# Patient Record
Sex: Male | Born: 1975 | Race: White | Hispanic: No | Marital: Married | Smoking: Never smoker
Health system: Southern US, Community
[De-identification: ages and names within clinical notes are randomized; demographics above are authoritative.]

## PROBLEM LIST (undated history)

## (undated) DIAGNOSIS — F1011 Alcohol abuse, in remission: Secondary | ICD-10-CM

## (undated) DIAGNOSIS — F909 Attention-deficit hyperactivity disorder, unspecified type: Secondary | ICD-10-CM

## (undated) DIAGNOSIS — F4310 Post-traumatic stress disorder, unspecified: Secondary | ICD-10-CM

## (undated) DIAGNOSIS — F32A Depression, unspecified: Secondary | ICD-10-CM

## (undated) DIAGNOSIS — Z87442 Personal history of urinary calculi: Secondary | ICD-10-CM

## (undated) DIAGNOSIS — J189 Pneumonia, unspecified organism: Secondary | ICD-10-CM

## (undated) DIAGNOSIS — F329 Major depressive disorder, single episode, unspecified: Secondary | ICD-10-CM

## (undated) DIAGNOSIS — H6060 Unspecified chronic otitis externa, unspecified ear: Secondary | ICD-10-CM

## (undated) DIAGNOSIS — B029 Zoster without complications: Secondary | ICD-10-CM

## (undated) DIAGNOSIS — J329 Chronic sinusitis, unspecified: Secondary | ICD-10-CM

## (undated) DIAGNOSIS — G4730 Sleep apnea, unspecified: Secondary | ICD-10-CM

## (undated) DIAGNOSIS — T6701XA Heatstroke and sunstroke, initial encounter: Secondary | ICD-10-CM

## (undated) DIAGNOSIS — K219 Gastro-esophageal reflux disease without esophagitis: Secondary | ICD-10-CM

## (undated) HISTORY — DX: Major depressive disorder, single episode, unspecified: F32.9

## (undated) HISTORY — DX: Zoster without complications: B02.9

## (undated) HISTORY — DX: Chronic sinusitis, unspecified: J32.9

## (undated) HISTORY — PX: OTHER SURGICAL HISTORY: SHX169

## (undated) HISTORY — DX: Depression, unspecified: F32.A

## (undated) HISTORY — DX: Unspecified chronic otitis externa, unspecified ear: H60.60

## (undated) HISTORY — DX: Heatstroke and sunstroke, initial encounter: T67.01XA

## (undated) HISTORY — PX: KNEE SURGERY: SHX244

## (undated) HISTORY — DX: Attention-deficit hyperactivity disorder, unspecified type: F90.9

---

## 2006-03-15 HISTORY — PX: SEPTOPLASTY: SUR1290

## 2007-05-03 ENCOUNTER — Ambulatory Visit: Payer: Self-pay | Admitting: Critical Care Medicine

## 2007-05-03 ENCOUNTER — Ambulatory Visit: Payer: Self-pay | Admitting: Internal Medicine

## 2007-05-03 DIAGNOSIS — J309 Allergic rhinitis, unspecified: Secondary | ICD-10-CM | POA: Insufficient documentation

## 2007-05-03 DIAGNOSIS — F329 Major depressive disorder, single episode, unspecified: Secondary | ICD-10-CM

## 2007-05-03 DIAGNOSIS — J310 Chronic rhinitis: Secondary | ICD-10-CM | POA: Insufficient documentation

## 2007-05-04 ENCOUNTER — Ambulatory Visit: Payer: Self-pay | Admitting: Internal Medicine

## 2007-05-04 LAB — CONVERTED CEMR LAB
Basophils Relative: 0.1 % (ref 0.0–1.0)
Hemoglobin: 16.7 g/dL (ref 13.0–17.0)
Lymphocytes Relative: 11.7 % — ABNORMAL LOW (ref 12.0–46.0)
MCHC: 33.1 g/dL (ref 30.0–36.0)
MCV: 92.3 fL (ref 78.0–100.0)
Monocytes Absolute: 0.2 10*3/uL (ref 0.2–0.7)
Monocytes Relative: 1.9 % — ABNORMAL LOW (ref 3.0–11.0)
Neutro Abs: 8.5 10*3/uL — ABNORMAL HIGH (ref 1.4–7.7)
Neutrophils Relative %: 86 % — ABNORMAL HIGH (ref 43.0–77.0)
Platelets: 269 10*3/uL (ref 150–400)
RBC: 5.45 M/uL (ref 4.22–5.81)
RDW: 12.9 % (ref 11.5–14.6)
WBC: 9.9 10*3/uL (ref 4.5–10.5)

## 2007-05-05 ENCOUNTER — Telehealth: Payer: Self-pay | Admitting: Critical Care Medicine

## 2007-05-05 DIAGNOSIS — K219 Gastro-esophageal reflux disease without esophagitis: Secondary | ICD-10-CM | POA: Insufficient documentation

## 2007-06-14 ENCOUNTER — Ambulatory Visit: Payer: Self-pay | Admitting: Internal Medicine

## 2007-12-14 ENCOUNTER — Ambulatory Visit: Payer: Self-pay | Admitting: Internal Medicine

## 2008-06-20 ENCOUNTER — Ambulatory Visit: Payer: Self-pay | Admitting: Internal Medicine

## 2009-01-08 ENCOUNTER — Ambulatory Visit (HOSPITAL_COMMUNITY): Admission: RE | Admit: 2009-01-08 | Discharge: 2009-01-08 | Payer: Self-pay | Admitting: Gastroenterology

## 2009-01-17 ENCOUNTER — Encounter: Admission: RE | Admit: 2009-01-17 | Discharge: 2009-01-17 | Payer: Self-pay | Admitting: Gastroenterology

## 2009-05-02 ENCOUNTER — Ambulatory Visit: Payer: Self-pay | Admitting: Internal Medicine

## 2009-05-02 ENCOUNTER — Emergency Department (HOSPITAL_COMMUNITY): Admission: EM | Admit: 2009-05-02 | Discharge: 2009-05-02 | Payer: Self-pay | Admitting: Emergency Medicine

## 2009-05-02 DIAGNOSIS — G4700 Insomnia, unspecified: Secondary | ICD-10-CM | POA: Insufficient documentation

## 2009-06-25 ENCOUNTER — Telehealth (INDEPENDENT_AMBULATORY_CARE_PROVIDER_SITE_OTHER): Payer: Self-pay | Admitting: *Deleted

## 2009-07-13 ENCOUNTER — Emergency Department (HOSPITAL_COMMUNITY)
Admission: EM | Admit: 2009-07-13 | Discharge: 2009-07-13 | Payer: Self-pay | Source: Home / Self Care | Admitting: Emergency Medicine

## 2009-10-03 ENCOUNTER — Ambulatory Visit: Payer: Self-pay | Admitting: Critical Care Medicine

## 2009-10-03 DIAGNOSIS — F1020 Alcohol dependence, uncomplicated: Secondary | ICD-10-CM | POA: Insufficient documentation

## 2009-10-03 DIAGNOSIS — J018 Other acute sinusitis: Secondary | ICD-10-CM

## 2009-10-03 DIAGNOSIS — F4310 Post-traumatic stress disorder, unspecified: Secondary | ICD-10-CM

## 2009-10-20 ENCOUNTER — Telehealth (INDEPENDENT_AMBULATORY_CARE_PROVIDER_SITE_OTHER): Payer: Self-pay | Admitting: *Deleted

## 2009-10-27 ENCOUNTER — Telehealth (INDEPENDENT_AMBULATORY_CARE_PROVIDER_SITE_OTHER): Payer: Self-pay | Admitting: *Deleted

## 2009-12-05 ENCOUNTER — Ambulatory Visit: Payer: Self-pay | Admitting: Critical Care Medicine

## 2009-12-05 DIAGNOSIS — R892 Abnormal level of other drugs, medicaments and biological substances in specimens from other organs, systems and tissues: Secondary | ICD-10-CM | POA: Insufficient documentation

## 2009-12-05 LAB — CONVERTED CEMR LAB
AST: 21 units/L (ref 0–37)
Bilirubin, Direct: 0.1 mg/dL (ref 0.0–0.3)
Total Bilirubin: 1.2 mg/dL (ref 0.3–1.2)

## 2009-12-08 ENCOUNTER — Encounter: Payer: Self-pay | Admitting: Critical Care Medicine

## 2009-12-08 LAB — CONVERTED CEMR LAB: IgE (Immunoglobulin E), Serum: 71.2 intl units/mL (ref 0.0–180.0)

## 2009-12-11 ENCOUNTER — Telehealth: Payer: Self-pay | Admitting: Critical Care Medicine

## 2009-12-18 ENCOUNTER — Encounter: Payer: Self-pay | Admitting: Critical Care Medicine

## 2009-12-23 ENCOUNTER — Ambulatory Visit: Payer: Self-pay | Admitting: Critical Care Medicine

## 2009-12-23 DIAGNOSIS — J45909 Unspecified asthma, uncomplicated: Secondary | ICD-10-CM

## 2010-01-20 ENCOUNTER — Ambulatory Visit: Payer: Self-pay | Admitting: Critical Care Medicine

## 2010-01-28 ENCOUNTER — Encounter: Payer: Self-pay | Admitting: Critical Care Medicine

## 2010-02-17 ENCOUNTER — Ambulatory Visit: Payer: Self-pay | Admitting: Critical Care Medicine

## 2010-03-25 ENCOUNTER — Ambulatory Visit
Admission: RE | Admit: 2010-03-25 | Discharge: 2010-03-25 | Payer: Self-pay | Source: Home / Self Care | Attending: Critical Care Medicine | Admitting: Critical Care Medicine

## 2010-03-28 ENCOUNTER — Emergency Department (HOSPITAL_COMMUNITY)
Admission: EM | Admit: 2010-03-28 | Discharge: 2010-03-28 | Payer: Self-pay | Source: Home / Self Care | Admitting: Emergency Medicine

## 2010-04-03 ENCOUNTER — Encounter: Payer: Self-pay | Admitting: Critical Care Medicine

## 2010-04-14 NOTE — Progress Notes (Signed)
Summary: talk to nurse - LMTCB x 1  Phone Note Call from Patient Call back at [facility] Phone [phone]   Caller: Patient Call For: wright Summary of Call: Pt states he needs a letter from PW stating that it's not recommended or likely that he'll return to work due to his disability. Initial call taken by: Darletta Moll,  October 20, 2009 9:42 AM  Follow-up for Phone Call        [facility] Yetta Barre RN  October 20, 2009 11:49 AM  pt states he needs a letter from dr Delford Field stating he does not rec. or it is not likely pt will be able to return to work, pt states pw is aware of this and that his pft's were done for his disability, pt states he meetas with the social security office on thursday at [facility] and would need this letter prior to appt---pls advise  Follow-up by: Lowell Makara CMA,  October 20, 2009 4:58 PM  Additional Follow-up for Phone Call Additional follow up Details #1::        I cannot write such a letter.  it is up to the disability office to make this determination All records can be sent to disability office  I do not write such letters Additional Follow-up by: Storm Frisk MD,  October 20, 2009 5:12 PM    Additional Follow-up for Phone Call Additional follow up Details #2::    Spoke with pt and notified of the above recs per PW.  Pt verbalized understanding. Follow-up by: Vernie Murders,  October 20, 2009 5:16 PM

## 2010-04-14 NOTE — Medication Information (Signed)
Summary: Clarification for Xolair/[facility]  Clarification for Xolair/[facility]   Imported By: Sherian Rein 12/17/2009 08:43:23  _____________________________________________________________________  External Attachment:    Type:   Image     Comment:   External Document

## 2010-04-14 NOTE — Medication Information (Signed)
Summary: Levocetirizine / [facility]  Levocetirizine / [facility]   Imported By: Lennie Odor 02/02/2010 16:59:13  _____________________________________________________________________  External Attachment:    Type:   Image     Comment:   External Document

## 2010-04-14 NOTE — Assessment & Plan Note (Signed)
Summary: xolair/jd  Nurse Visit   Allergies: 1)  ! * Antrax Vaccine 2)  ! * Beta 2 Angonist 3)  ! * Spiriva  Medication Administration  Injection # 1:    Medication: Xolair (omalizumab) 150mg     Route: SQ    Site: L deltoid    Exp Date: 12/2012    Lot #: [id number]    Mfr: Salome Spotted    Comments: 1.2 ml in left arm pt waited 2 hrs     Given by: tammy scott in allergy lab  Orders Added: 1)  Administration xolair injection [78295]   Medication Administration  Injection # 1:    Medication: Xolair (omalizumab) 150mg     Route: SQ    Site: L deltoid    Exp Date: 12/2012    Lot #: [id number]    Mfr: Salome Spotted    Comments: 1.2 ml in left arm pt waited 2 hrs     Given by: tammy scott in allergy lab  Orders Added: 1)  Administration xolair injection [phone]

## 2010-04-14 NOTE — Progress Notes (Signed)
Summary: clarification on Xolair  Phone Note From Pharmacy Call back at [phone]   Caller: Patient Caller: [facility]  ( kara Call For: wright  Summary of Call: need clarification on xolair 30 or 90 Initial call taken by: Rickard Patience,  December 11, 2009 11:26 AM  Follow-up for Phone Call        will forward message to [facility] to address.  Aundra Millet Reynolds LPN  December 11, 2009 11:46 AM   Called and spoke with [facility]. They were requesting to know if we needed a 30 day supply or 90 day supply. I informed her that if ins would pay for a 90 day supply then that's what we would prefer. Spoke with pharmacist at [facility] and she will be sending a 90 day supply if ins will approve. Alfonso Ramus  December 11, 2009 12:14 PM

## 2010-04-14 NOTE — Progress Notes (Signed)
  Phone Note Other Incoming   Request: Send information Summary of Call: Request for records received from [facility]. Request forwarded to [facility].     

## 2010-04-14 NOTE — Assessment & Plan Note (Signed)
Summary: Pulmonary OV   Copy to:  [facility] Primary Provider/Referring Provider:  T. Spear  CC:  Follow up.  Pt was last seen 04/2007.  Pt states breathing is "tight this morning."  States he can no longer take advair-would like to discuss changing asthma meds and requesting PFTs.  .  History of Present Illness: Pulmonary OV  35yo WM with severe persistent asthma, chronic sinusitis, severe atopy Hx Etoh abuse, now in detox  May 02, 2009- Rhinosinusitis, asthma Asks about "steroid" side effects. Tells me he had used ambien x 3 years - last taken 2 nights ago on 04/30/09. Last took a prednisone taper from Dr Collins [place] 2 weeks ago. Had gotten phenergan inj 2/14. Claims he was "completely unconcious for a day after that" for N&V/ GI bug shared with his son. Sleep walking night of 2/16- thinks he was dreaming of climbing a mountain. He climbed pull down attic stair, fell, scraping arm and cutting knee. 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 Has had nightmares, was in counseling for awhile, bad trip on Nyquil in past, but never told PTSD. Now he's afraid of sleeping, afraid he might be violent. Family stress with wife. Says no ETOH in long time. 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 Similar event in past when he was drinking and on theophylline. Has been on pred taper 3-5 x/ year. He is taking both Advair and Qvar.    October 03, 2009 9:22 AM Last seen 2/09 for asthma eval Dr young has worked with the patient more recently last seen 2/11 per allergy. The pt saw dr bates and did ear surgery.  this drained the sinuses.  The pt then saw dr young and did not do the allergy injections, he did not f/u on allergy testing Pt now under detox for alcohol abuse. He cannot tol LABAs due to tachycardia and HTN side effects He is on ICS two forms.  He cannot tol steroids.Presents a list of multiple meds he needs to avoid due to  abuse potential Pt still dyspneic and uses SABAs daily    Asthma History    Initial Asthma Severity Rating:    Age range: 12+ years    Symptoms: daily    Nighttime Awakenings: >1/week but not nightly    Interferes w/ normal activity: some limitations    SABA use (not for EIB): daily    Exacerbations requiring oral systemic steroids: 0-1/year    Asthma Severity Assessment: Moderate Persistent   Preventive Screening-Counseling & Management  Alcohol-Tobacco     Smoking Status: never  Current Medications (verified): 1)  Albuterol Sulfate (2.5 Mg/65ml) 0.083%  Nebu (Albuterol Sulfate) .... As Needed 2)  Nasonex 50 Mcg/act  Susp (Mometasone Furoate) .... Two Puffs Each Nostril Daily 3)  Xyzal 5 Mg Tabs (Levocetirizine Dihydrochloride) .... 2 Once Daily 4)  Qvar 80 Mcg/act  Aers (Beclomethasone Dipropionate) .... Two  Puffs Twice Daily 5)  Proventil Hfa 108 (90 Base) Mcg/act Aers (Albuterol Sulfate) .... 2puffs Four Times A Day Prn 6)  Prevacid 30 Mg Cpdr (Lansoprazole) .... Take 1 By Mouth Once Daily 7)  Pulmicort 0.25 Mg/33ml Susp (Budesonide) .... Once Daily  Allergies (verified): 1)  ! * Antrax Vaccine  Past History:  Past medical, surgical, family and social histories (including risk factors) reviewed, and no changes noted (except as noted below).  Past Medical History: Reviewed history from 12/14/2007 and no changes required. Chronic  otitis asthma- Pos Immunocap and Skin tests 06/14/07 shingles sinusitis heat stroke age 76 depression ADHD  Past Surgical History: Reviewed history from 06/20/2008 and no changes required. Septoplasty bilateral tympanoplasty and removal of cholesteatoma  Family History: Reviewed history from 05/04/2007 and no changes required. GF died stomach cancer Father allergic rhinitis, sleep apnea MI/Heart Attack  Social History: Reviewed history from 05/02/2009 and no changes required. 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  The patient denies coughing up blood, chest pain, irregular heartbeats, acid heartburn, indigestion, loss of appetite, weight change, abdominal pain, difficulty swallowing, sore throat, tooth/dental problems, headaches, sneezing, itching, ear ache, anxiety, depression, hand/feet swelling, joint stiffness or pain, rash, change in color of mucus, and fever.    Vital Signs:  Patient profile:   35 year old male Height:      68 inches Weight:      188.50 pounds BMI:     28.76 O2 Sat:      95 % on Room air Temp:     97.8 degrees F oral Pulse rate:   84 / minute BP sitting:   110 / 80  (left arm) Cuff size:   regular  Vitals Entered By: Gweneth Dimitri RN (October 03, 2009 9:18 AM)  O2 Flow:  Room air CC: Follow up.  Pt was last seen 04/2007.  Pt states breathing is "tight this morning."  States he can no longer take advair-would like to discuss changing asthma meds and requesting PFTs.   Comments Medications reviewed with patient Daytime contact number verified with patient. Gweneth Dimitri RN  October 03, 2009 9:19 AM    Physical Exam  Additional Exam:  General: A/Ox3; pleasant and cooperative, NAD, anxious/ stressed, alert, communicative, coherent SKIN: no rash, lesions NODES: no lymphadenopathy HEENT: [place]/AT, EOM- WNL, Conjuctivae- clear, PERRLA, TM-WNL, Nose- bilat purulent material in both nares, Throat- clear and wnl NECK: Supple w/ fair ROM, JVD- none, normal carotid impulses w/o bruits Thyroid- CHEST: exp wheeze, poor airflow HEART: RRR, no m/g/r heard ABDOMEN:  ZOX:WRUE, nl pulses, no edema  NEURO: Grossly intact to observation, anxious but no tremor.      Pulmonary Function Test Date: 10/03/2009 09:41 AM Gender:  Male  Pre-Spirometry FVC    Value: 2.83 L/min   % Pred: 56 % FEV1    Value: 1.99 L     Pred: 4.11 L     % Pred: 48.30 % FEV1/FVC  Value: 70.19 %     % Pred: 86.40 %  Impression & Recommendations:  Problem # 1:  OTHER ACUTE SINUSITIS (ICD-461.8) Assessment Deteriorated acute on chronic sinusitis with asthma flare plan avelox x 7 days depomedrol 120mg  IM ,  avoid prednisone nasal hygiene  His updated medication list for this problem includes:    Nasonex 50 Mcg/act Susp (Mometasone furoate) .Marland Kitchen..Marland Kitchen Two puffs each nostril daily    Avelox 400 Mg Tabs (Moxifloxacin hcl) ..... By mouth daily  Orders: Est. Patient Level IV (45409)  Problem # 2:  ASTHMA (ICD-V17.5) Assessment: Deteriorated severe persistent asthma with flare plan depo medrol 120mg  IM spiro shows severe airflow obstruction trial spiriva increase qvar to 4puff two times a day   Orders: Est. Patient Level IV (81191) Spirometry w/Graph (47829) Depo- Medrol 40mg  (J1030) Depo- Medrol  80mg  (J1040) Admin of Therapeutic Inj  intramuscular or subcutaneous (04540)  Medications Added to Medication List This Visit: 1)  Xyzal 5 Mg Tabs (Levocetirizine dihydrochloride) .... 2 once daily 2)  Qvar 80 Mcg/act Aers (Beclomethasone dipropionate) .... 4   puffs twice daily 3)  Pulmicort 0.25 Mg/18ml Susp (Budesonide) .... Once daily 4)  Avelox 400 Mg Tabs (Moxifloxacin hcl) .... By mouth daily 5)  Spiriva Handihaler 18 Mcg Caps (Tiotropium bromide monohydrate) .... Two puffs in handihaler daily  Complete Medication List: 1)  Albuterol Sulfate (2.5 Mg/81ml) 0.083% Nebu (Albuterol sulfate) .... As needed 2)  Nasonex 50 Mcg/act Susp (Mometasone furoate) .... Two puffs each nostril daily 3)  Xyzal 5 Mg Tabs (Levocetirizine dihydrochloride) .... 2 once daily 4)  Qvar 80 Mcg/act Aers (Beclomethasone dipropionate) .... 4   puffs twice daily 5)  Proventil Hfa 108 (90 Base) Mcg/act Aers (Albuterol sulfate) .... 2puffs four times a day  prn 6)  Prevacid 30 Mg Cpdr (Lansoprazole) .... Take 1 by mouth once daily 7)  Pulmicort 0.25 Mg/65ml Susp (Budesonide) .... Once daily 8)  Avelox 400 Mg Tabs (Moxifloxacin hcl) .... By mouth daily 9)  Spiriva Handihaler 18 Mcg Caps (Tiotropium bromide monohydrate) .... Two puffs in handihaler daily  Patient Instructions: 1)  A depomedrol injection 120mg  IM will be given 2)  Trial Spiriva daily 3)  Avelox one daily for 5 days (samples given) 4)  Increase Qvar to  4 puffs twice daily 5)  No other medication changes 6)  Return 2 months Prescriptions: QVAR 80 MCG/ACT  AERS (BECLOMETHASONE DIPROPIONATE) 4   puffs twice daily  #1 month x 6   Entered and Authorized by:   Storm Frisk MD   Signed by:   Storm Frisk MD on 10/03/2009   Method used:   Electronically to        [facility]. [phone]* (retail)       [address]       [place], [place]  [address]       Ph: [phone]       Fax: [phone]   RxID:   [id number] SPIRIVA HANDIHALER 18 MCG  CAPS (TIOTROPIUM BROMIDE MONOHYDRATE) Two puffs in handihaler daily  #30 x 6   Entered and Authorized by:   Storm Frisk MD   Signed by:   Storm Frisk MD on 10/03/2009   Method used:   Print then Give to Patient   RxID:   [id number]      CardioPerfect Spirometry  ID: [id number] Patient: Chad Hurley DOB: 06/21/1975 Age: 35 Years Old Sex: Male Race: White Physician: Delford Field Height: 68 Weight: 188.50 Status: Confirmed Past Medical History:  Chronic otitis asthma- Pos Immunocap and Skin tests 06/14/07 shingles sinusitis heat stroke age 32 depression ADHD Recorded: 10/03/2009 09:41 AM  Parameter  Measured Predicted %Predicted FVC     2.83        5.05        56 FEV1     1.99        4.11        48.30 FEV1%   70.19        81.25        86.40 PEF    4.85        9.75        49.80   Comments: Severe obstructive defect  Interpretation: Pre: FVC= 2.83L FEV1=  1.99L FEV1%= 70.2% 1.99/2.83  FEV1/FVC (10/03/2009 9:51:28 AM), Severe obstruction.Moderately severe restriction       Medication Administration  Injection # 1:    Medication: Depo- Medrol 40mg     Diagnosis: ASTHMA (ICD-V17.5)    Route: IM    Site: LUOQ gluteus    Exp Date: 06/2012    Lot #: [id number]    Mfr: Pharmacia    Patient tolerated injection without complications    Given by: Gweneth Dimitri RN (October 03, 2009 10:10 AM)  Injection # 2:    Medication: Depo- Medrol 80mg     Diagnosis: ASTHMA (ICD-V17.5)    Route: IM    Site: LUOQ gluteus    Exp Date: 06/2012    Lot #: [id number]    Mfr: Pharmacia    Patient tolerated injection without complications    Given by: Gweneth Dimitri RN (October 03, 2009 10:10 AM)  Orders Added: 1)  Est. Patient Level IV [16109] 2)  Spirometry w/Graph [94010] 3)  Depo- Medrol 40mg  [J1030] 4)  Depo- Medrol 80mg  [J1040] 5)  Admin of Therapeutic Inj  intramuscular or subcutaneous [96372]   Appended Document: Pulmonary OV fax Herb Grays

## 2010-04-14 NOTE — Miscellaneous (Signed)
Summary: Injection program for [employer] program for [employer]   Imported By: Sherian Rein 12/29/2009 11:44:49  _____________________________________________________________________  External Attachment:    Type:   Image     Comment:   External Document

## 2010-04-14 NOTE — Letter (Signed)
Summary: [facility] for [facility]air[facility]  [facility] for [facility]   Imported By: Sherian Rein 12/29/2009 11:48:43  _____________________________________________________________________  External Attachment:    Type:   Image     Comment:   External Document

## 2010-04-14 NOTE — Assessment & Plan Note (Signed)
Summary: Pulmonary OV   Copy to:  [facility] Primary Denasia Venn/Referring Jemuel Laursen:  T. Spear  CC:  6 wk follow up.  Pt states up until the last week breathing was going "outstanding."  increased SOB at rest and with activity, wheezing, chest tightness, and nonprod cough x 1 wk.  Pt states Geoffry Paradise is working really well.  Marland Kitchen  History of Present Illness: Pulmonary OV  34yo WM with severe persistent asthma, chronic sinusitis, severe atopy Hx Etoh abuse, now in detox  May 02, 2009- Rhinosinusitis, asthma Asks about "steroid" side effects. Tells me he had used ambien x 3 years - last taken 2 nights ago on 04/30/09. Last took a prednisone taper from Dr Collins [place] 2 weeks ago. Had gotten phenergan inj 2/14. Claims he was "completely unconcious for a day after that" for N&V/ GI bug shared with his son. Sleep walking night of 2/16- thinks he was dreaming of climbing a mountain. He climbed pull down attic stair, fell, scraping arm and cutting knee. 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 Has had nightmares, was in counseling for awhile, bad trip on Nyquil in past, but never told PTSD. Now he's afraid of sleeping, afraid he might be violent. Family stress with wife. Says no ETOH in long time. 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 Similar event in past when he was drinking and on theophylline. Has been on pred taper 3-5 x/ year. He is taking both Advair and Qvar.    October 03, 2009 9:22 AM Last seen 2/09 for asthma eval Dr young has worked with the patient more recently last seen 2/11 per allergy. The pt saw dr bates and did ear surgery.  this drained the sinuses.  The pt then saw dr young and did not do the allergy injections, he did not f/u on allergy testing Pt now under detox for alcohol abuse. He cannot tol LABAs due to tachycardia and HTN side effects He is on ICS two forms.  He cannot tol steroids.Presents a list of  multiple meds he needs to avoid due to abuse potential Pt still dyspneic and uses SABAs dailySeptember 23, 2011 4:11 PM Pt notes symptoms the same.  Pt still with frequent rescue inhaler use.   Spiriva made pt sleepy, not able to use LABAs.  Has not had xolair eval. Has never been on zyflo  January 20, 2010 9:34 AM overall this patient is improved since starting Xolair therapy. The patient is a less wheezing and cough. There is less shortness of breath. The patient states that the Xolair injection does not last month. The patient is questioning a dosage adjustment. There is no fever, chills, or sweats. There is no chest pain. Overall, the patient is improved. Patient was not able tolerate Zyflo because of  insomnia.  Current Medications (verified): 1)  Albuterol Sulfate (2.5 Mg/42ml) 0.083%  Nebu (Albuterol Sulfate) .... As Needed 2)  Nasonex 50 Mcg/act  Susp (Mometasone Furoate) .... Two Puffs Each Nostril Daily As Needed 3)  Xyzal 5 Mg Tabs (Levocetirizine Dihydrochloride) .... 2 Once Daily 4)  Qvar 80 Mcg/act  Aers (Beclomethasone Dipropionate) .... 4   Puffs Twice Daily As Needed 5)  Proventil Hfa 108 (90 Base) Mcg/act Aers (Albuterol Sulfate) .... 2puffs Four Times A Day Prn 6)  Prevacid 30 Mg Cpdr (Lansoprazole) .... 2 At Bedtime 7)  Pulmicort 0.25 Mg/64ml Susp (Budesonide) .... Once Daily As Needed 8)  Xolair 150 Mg Solr (Omalizumab) .... Once Monthly  Allergies (verified): 1)  ! * Antrax Vaccine 2)  ! * Beta 2 Angonist 3)  ! * Spiriva  Past History:  Past medical, surgical, family and social histories (including risk factors) reviewed, and no changes noted (except as noted below).  Past Medical History: Reviewed history from 12/14/2007 and no changes required. Chronic otitis asthma- Pos Immunocap and Skin tests 06/14/07 shingles sinusitis heat stroke age 5 depression ADHD  Past Surgical History: Reviewed history from 06/20/2008 and no changes  required. Septoplasty bilateral tympanoplasty and removal of cholesteatoma  Family History: Reviewed history from 05/04/2007 and no changes required. GF died stomach cancer Father allergic rhinitis, sleep apnea MI/Heart Attack  Social History: Reviewed history from 05/02/2009 and no changes required. 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  The patient denies shortness of breath at rest, productive cough, non-productive cough, coughing up blood, chest pain, irregular heartbeats, acid heartburn, indigestion, loss of appetite, weight change, abdominal pain, difficulty swallowing, sore throat, tooth/dental problems, headaches, nasal congestion/difficulty breathing through nose, sneezing, itching, ear ache, anxiety, depression, hand/feet swelling, joint stiffness or pain, rash, change in color of mucus, and fever.    Vital Signs:  Patient profile:   35 year old male Height:      68 inches Weight:      196.38 pounds BMI:     29.97 O2 Sat:      98 % on Room air Temp:     98.5 degrees F oral Pulse rate:   80 / minute BP sitting:   126 / 88  (left arm) Cuff size:   regular  Vitals Entered By: Gweneth Dimitri RN (January 20, 2010 9:29 AM)  O2 Flow:  Room air CC: 6 wk follow up.  Pt states up until the last week breathing was going "outstanding."  increased SOB at rest and with activity, wheezing, chest tightness, nonprod cough x 1 wk.  Pt states Geoffry Paradise is working really well.    Does patient need assistance? Functional Status Self care Comments Medications reviewed with patient Daytime contact number verified with patient. Crystal Jones RN  January 20, 2010 9:30 AM    Physical Exam  Additional Exam:  General: A/Ox3; pleasant and cooperative, NAD, anxious/ stressed, alert, communicative, coherent SKIN: no rash, lesions NODES: no  lymphadenopathy HEENT: [employer]/AT, EOM- WNL, Conjuctivae- clear, PERRLA, TM-WNL, Nose- bilat purulent material in both nares, Throat- clear and wnl NECK: Supple w/ fair ROM, JVD- none, normal carotid impulses w/o bruits Thyroid- CHEST: no wheeze, improved airflow HEART: RRR, no m/g/r heard ABDOMEN:  ZOX:WRUE, nl pulses, no edema  NEURO: Grossly intact to observation, anxious but no tremor.      Impression & Recommendations:  Problem # 1:  EXTRINSIC ASTHMA, UNSPECIFIED (ICD-493.00) Assessment Improved Asthma better with xolair but weight is increased 150mg  monthly not lasting this patient Plan increase xolair to 300mg  monthly Use qvar 4puffs twice daily  Medications Added to Medication List This Visit: 1)  Nasonex 50 Mcg/act Susp (Mometasone furoate) .... Two puffs each nostril daily as needed 2)  Qvar 80 Mcg/act Aers (Beclomethasone dipropionate) .... 4   puffs twice daily 3)  Prevacid 30 Mg Cpdr (Lansoprazole) .... 2 at bedtime 4)  Pulmicort 0.25 Mg/60ml Susp (Budesonide) .... Once daily as needed 5)  Xolair 150 Mg Solr (Omalizumab) .Marland KitchenMarland KitchenMarland Kitchen  300mg  subcutaneously monthly  Complete Medication List: 1)  Albuterol Sulfate (2.5 Mg/42ml) 0.083% Nebu (Albuterol sulfate) .... As needed 2)  Nasonex 50 Mcg/act Susp (Mometasone furoate) .... Two puffs each nostril daily as needed 3)  Xyzal 5 Mg Tabs (Levocetirizine dihydrochloride) .... 2 once daily 4)  Qvar 80 Mcg/act Aers (Beclomethasone dipropionate) .... 4   puffs twice daily 5)  Proventil Hfa 108 (90 Base) Mcg/act Aers (Albuterol sulfate) .... 2puffs four times a day prn 6)  Prevacid 30 Mg Cpdr (Lansoprazole) .... 2 at bedtime 7)  Pulmicort 0.25 Mg/56ml Susp (Budesonide) .... Once daily as needed 8)  Xolair 150 Mg Solr (Omalizumab) .... 300mg  subcutaneously monthly  Other Orders: Est. Patient Level IV (04540) Misc. Referral (Misc. Ref)  Patient Instructions: 1)  We will increase Xolair to 300mg  subcutaneously monthly 2)  You must use the  Qvar 4 puffs twice daily 3)  Return 2 months Prescriptions: XYZAL 5 MG TABS (LEVOCETIRIZINE DIHYDROCHLORIDE) 2 once daily  #90 day x 4   Entered and Authorized by:   Storm Frisk MD   Signed by:   Storm Frisk MD on 01/20/2010   Method used:   Faxed to ...       [facility] education [facility])       [address] [address]       [place], [place]  [address]       Ph: [phone]       Fax: [phone]   RxID:   [id number] XYZAL 5 MG TABS (LEVOCETIRIZINE DIHYDROCHLORIDE) 2 once daily  #1 month x 6   Entered and Authorized by:   Storm Frisk MD   Signed by:   Storm Frisk MD on 01/20/2010   Method used:   Electronically to        [facility]. [phone]* (retail)       [address]       [place], [place]  [address]       Ph: [phone]       Fax: [phone]   RxID:   [id number] XOLAIR 150 MG SOLR (OMALIZUMAB) 300mg  subcutaneously monthly  #3 month x 6   Entered and Authorized by:   Storm Frisk MD   Signed by:   Storm Frisk MD on 01/20/2010   Method used:   Print then Give to Patient   RxID:   [id number]     Appended Document: Pulmonary OV fax Tammy spear

## 2010-04-14 NOTE — Medication Information (Signed)
Summary: [employer] Service request for [employer]   [employer] Service request for Xolair/[facility]   Imported By: Sherian Rein 12/29/2009 11:54:26  _____________________________________________________________________  External Attachment:    Type:   Image     Comment:   External Document

## 2010-04-14 NOTE — Assessment & Plan Note (Signed)
Summary: Pulmonary OV   Copy to:  [facility] Primary Provider/Referring Provider:  T. Spear  CC:  2 month follow up.  Pt states breathing is only slightly better.  Still having SOB with any activity, waking up at night d/t SOB, wheezing, and chest tightness.  Stopped spiriva bc "it put me to sleep for two days."  .  History of Present Illness: Pulmonary OV  35yo WM with severe persistent asthma, chronic sinusitis, severe atopy Hx Etoh abuse, now in detox  May 02, 2009- Rhinosinusitis, asthma Asks about "steroid" side effects. Tells me he had used ambien x 3 years - last taken 2 nights ago on 04/30/09. Last took a prednisone taper from Dr Collins [place] 2 weeks ago. Had gotten phenergan inj 2/14. Claims he was "completely unconcious for a day after that" for N&V/ GI bug shared with his son. Sleep walking night of 2/16- thinks he was dreaming of climbing a mountain. He climbed pull down attic stair, fell, scraping arm and cutting knee. 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 Has had nightmares, was in counseling for awhile, bad trip on Nyquil in past, but never told PTSD. Now he's afraid of sleeping, afraid he might be violent. Family stress with wife. Says no ETOH in long time. 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 Similar event in past when he was drinking and on theophylline. Has been on pred taper 3-5 x/ year. He is taking both Advair and Qvar.    October 03, 2009 9:22 AM Last seen 2/09 for asthma eval Dr young has worked with the patient more recently last seen 2/11 per allergy. The pt saw dr bates and did ear surgery.  this drained the sinuses.  The pt then saw dr young and did not do the allergy injections, he did not f/u on allergy testing Pt now under detox for alcohol abuse. He cannot tol LABAs due to tachycardia and HTN side effects He is on ICS two forms.  He cannot tol steroids.Presents a list of multiple  meds he needs to avoid due to abuse potential Pt still dyspneic and uses SABAs dailySeptember 23, 2011 4:11 PM Pt notes symptoms the same.  Pt still with frequent rescue inhaler use.   Spiriva made pt sleepy, not able to use LABAs.  Has not had xolair eval. Has never been on zyflo    Asthma History    Asthma Control Assessment:    Age range: 12+ years    Symptoms: throughout the day    Nighttime Awakenings: 1-3/week    Interferes w/ normal activity: some limitations    SABA use (not for EIB): >2 days/week    ATAQ questionnaire: 1-2    FEV1: 1.99 liters (today)    FEV1 Pred: 4.11 liters (today)    Exacerbations requiring oral systemic steroids: 0-1/year    Asthma Control Assessment: Very Poorly Controlled   Preventive Screening-Counseling & Management  Alcohol-Tobacco     Smoking Status: never  Current Medications (verified): 1)  Albuterol Sulfate (2.5 Mg/67ml) 0.083%  Nebu (Albuterol Sulfate) .... As Needed 2)  Nasonex 50 Mcg/act  Susp (Mometasone Furoate) .... Two Puffs Each Nostril Daily 3)  Xyzal 5 Mg Tabs (Levocetirizine Dihydrochloride) .... 2 Once Daily 4)  Qvar 80 Mcg/act  Aers (Beclomethasone Dipropionate) .... 4   Puffs Twice Daily 5)  Proventil Hfa 108 (90 Base) Mcg/act Aers (Albuterol Sulfate) .... 2puffs Four Times A Day Prn  6)  Prevacid 30 Mg Cpdr (Lansoprazole) .... Take 1 By Mouth Once Daily 7)  Pulmicort 0.25 Mg/63ml Susp (Budesonide) .... Once Daily  Allergies (verified): 1)  ! * Antrax Vaccine 2)  ! * Beta 2 Angonist 3)  ! * Spiriva  Past History:  Past medical, surgical, family and social histories (including risk factors) reviewed, and no changes noted (except as noted below).  Past Medical History: Reviewed history from 12/14/2007 and no changes required. Chronic otitis asthma- Pos Immunocap and Skin tests 06/14/07 shingles sinusitis heat stroke age 33 depression ADHD  Past Surgical History: Reviewed history from 06/20/2008 and no changes  required. Septoplasty bilateral tympanoplasty and removal of cholesteatoma  Family History: Reviewed history from 05/04/2007 and no changes required. GF died stomach cancer Father allergic rhinitis, sleep apnea MI/Heart Attack  Social History: Reviewed history from 05/02/2009 and no changes required. 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  [employer] business admin  Review of Systems       The patient complains of shortness of breath with activity and non-productive cough.  The patient denies shortness of breath at rest, productive cough, coughing up blood, chest pain, irregular heartbeats, acid heartburn, indigestion, loss of appetite, weight change, abdominal pain, difficulty swallowing, sore throat, tooth/dental problems, headaches, nasal congestion/difficulty breathing through nose, sneezing, itching, ear ache, anxiety, depression, hand/feet swelling, joint stiffness or pain, rash, change in color of mucus, and fever.    Vital Signs:  Patient profile:   35 year old male Height:      68 inches Weight:      190 pounds BMI:     28.99 O2 Sat:      97 % on Room air Temp:     97.9 degrees F oral Pulse rate:   58 / minute BP sitting:   130 / 90  (left arm) Cuff size:   regular  Vitals Entered By: Gweneth Dimitri RN (December 05, 2009 4:01 PM)  Nutrition Counseling: Patient's BMI is greater than 25 and therefore counseled on weight management options.  O2 Flow:  Room air CC: 2 month follow up.  Pt states breathing is only slightly better.  Still having SOB with any activity, waking up at night d/t SOB, wheezing, chest tightness.  Stopped spiriva bc "it put me to sleep for two days."   Comments Medications reviewed with patient Daytime contact number verified with patient. Gweneth Dimitri RN  December 05, 2009 4:02 PM    Physical Exam  Additional Exam:  General: A/Ox3; pleasant and cooperative, NAD, anxious/ stressed, alert, communicative,  coherent SKIN: no rash, lesions NODES: no lymphadenopathy HEENT: [employer]/AT, EOM- WNL, Conjuctivae- clear, PERRLA, TM-WNL, Nose- bilat purulent material in both nares, Throat- clear and wnl NECK: Supple w/ fair ROM, JVD- none, normal carotid impulses w/o bruits Thyroid- CHEST: no wheeze, poor airflow HEART: RRR, no m/g/r heard ABDOMEN:  ZOX:WRUE, nl pulses, no edema  NEURO: Grossly intact to observation, anxious but no tremor.      Pre-Spirometry FEV1    Value: 1.99 L     Pred: 4.11 L     Impression & Recommendations:  Problem # 1:  ASTHMA (ICD-V17.5) Assessment Unchanged  Orders: Est. Patient Level IV (45409) T-IgE (Immunoglobulin E) (81191-47829)  severe persistent asthma with flare plan IgE high  will assess for xolair trial zyflo not baseline lfts normal  cont ics   Medications Added to Medication List This Visit: 1)  Zyflo Cr 600 Mg Tb12 (Zileuton) .... Two  tablets  by mouth twice daily  Complete Medication List: 1)  Albuterol Sulfate (2.5 Mg/62ml) 0.083% Nebu (Albuterol sulfate) .... As needed 2)  Nasonex 50 Mcg/act Susp (Mometasone furoate) .... Two puffs each nostril daily 3)  Xyzal 5 Mg Tabs (Levocetirizine dihydrochloride) .... 2 once daily 4)  Qvar 80 Mcg/act Aers (Beclomethasone dipropionate) .... 4   puffs twice daily 5)  Proventil Hfa 108 (90 Base) Mcg/act Aers (Albuterol sulfate) .... 2puffs four times a day prn 6)  Prevacid 30 Mg Cpdr (Lansoprazole) .... Take 1 by mouth once daily 7)  Pulmicort 0.25 Mg/56ml Susp (Budesonide) .... Once daily 8)  Zyflo Cr 600 Mg Tb12 (Zileuton) .... Two  tablets  by mouth twice daily  Other Orders: TLB-Hepatic/Liver Function Pnl (80076-HEPATIC)  Patient Instructions: 1)  Start Zyflo two twice daily 2)  Labs today, evaluate for possible Xolair  3)  No other medication changes 4)  Return 6 weeks Prescriptions: ZYFLO CR 600 MG  TB12 (ZILEUTON) Two  tablets  by mouth twice daily  #1 month x 6   Entered and Authorized by:    Storm Frisk MD   Signed by:   Storm Frisk MD on 12/05/2009   Method used:   Print then Give to Patient   RxID:   [id number]    Immunization History:  Influenza Immunization History:    Influenza:  historical (11/13/2009)  Pneumovax Immunization History:    Pneumovax:  historical (11/13/2009)   Appended Document: Pulmonary OV fax T spear

## 2010-04-14 NOTE — Progress Notes (Signed)
Summary: Records request from [facility]  Request for records received from [facility]. Request forwarded to [facility]. Wilder Glade  June 25, 2009 4:47 PM  Appended Document: Records request from [facility] Request received from [facility] forwarded to [facility].

## 2010-04-14 NOTE — Assessment & Plan Note (Signed)
Summary: rov//mbw   Copy to:  [facility] Primary Provider/Referring Provider:  T. Spear  CC:  ? side effects from Steroids..  History of Present Illness:  06/14/07-Returns today for allergy testing. Dr. Delford Field had demonstrated significant sinusitis with Sinus CT. He took xyzal 2 days ago, although we asked he stay off 3 days for testing, because he "had to". Expects seasonal nose and chest flare in Spring and Fall. Mold and dust also trigger. Has not needed to use nebulizer since return from Service in [place]. He is using both QVAr and Advair 500 and expresses satisfaction with his asthma control. Lab- RAST positive grasses IgE 61.6 EOS 0.1%  Allergy skin testing- significant positives grass, weeds, some trees, dust mite. We discussed thoroughly.  12/14/07-  Using neb two times a day x 2 wks. Feels tighter. Claims to have interacting anthrax vaccine and Gulf War Syndrome- so afraid of flu vax.  06/20/08- Rhinosinusitis, asthma Staying in to avoid pollen. Starting to get more congested in head and chest and asks a pred taper to hold. He is using his neb about 3 x/week, and using his other meds as directed.  May 02, 2009- Rhinosinusitis, asthma Asks about "steroid" side effects. Tells me he had used ambien x 3 years - last taken 2 nights ago on 04/30/09. Last took a prednisone taper from Dr Collins [place] 2 weeks ago. Had gotten phenergan inj 2/14. Claims he was "completely unconcious for a day after that" for N&V/ GI bug shared with his son. Sleep walking night of 2/16- thinks he was dreaming of climbing a mountain. He climbed pull down attic stair, fell, scraping arm and cutting knee. 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 Has had nightmares, was in counseling for awhile, bad trip on Nyquil in past, but never told PTSD. Now he's afraid of sleeping, afraid he might be violent. Family stress with wife. Says no ETOH in long time. Travelling back and forth  to [facility] for business admin degree. Similar event in past when he was drinking and on theophylline. Has been on pred taper 3-5 x/ year. He is taking both Advair and Qvar.      Current Medications (verified): 1)  Advair Diskus 500-50 Mcg/dose  Misc (Fluticasone-Salmeterol) .Marland Kitchen.. 1 Puff Two Times A Day 2)  Albuterol Sulfate (2.5 Mg/32ml) 0.083%  Nebu (Albuterol Sulfate) .... As Needed 3)  Nasonex 50 Mcg/act  Susp (Mometasone Furoate) .... Two Puffs Each Nostril Daily 4)  Xyzal 5 Mg  Tabs (Levocetirizine Dihydrochloride) .Marland Kitchen.. 1 By Mouth Daily 5)  Qvar 80 Mcg/act  Aers (Beclomethasone Dipropionate) .... Two  Puffs Twice Daily 6)  Proventil Hfa 108 (90 Base) Mcg/act Aers (Albuterol Sulfate) .... 2puffs Four Times A Day Prn 7)  Adderall 5 Mg  Tabs (Amphetamine-Dextroamphetamine) .... One Tablet Twice Daily 8)  Naltrexone Hcl 50 Mg Tabs (Naltrexone Hcl) .... Take 1/2 By Mouth Once Daily 9)  Clonidine Hcl 0.1 Mg Tabs (Clonidine Hcl) .... Take One Tablet Daily. 10)  Prevacid 30 Mg Cpdr (Lansoprazole) .... Take 1 By Mouth Once Daily  Allergies (verified): 1)  ! 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  [employer] business admin  Risk Factors: Smoking Status: never (05/04/2007)  Social History: Patient never smoked.  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    Vital Signs:  Patient profile:   35 year old male Height:      68 inches Weight:      174.13 pounds BMI:     26.57 O2 Sat:      98 % on Room air Pulse rate:   85 / minute BP sitting:   130 / 92  (left arm) Cuff size:   regular  Vitals Entered By: Reynaldo Minium CMA (May 02, 2009 10:10 AM)  O2 Flow:  Room air  Physical Exam  Additional Exam:  General: A/Ox3; pleasant and cooperative, NAD, anxious/ stressed, alert, communicative, coherent SKIN: no rash, lesions NODES: no lymphadenopathy HEENT: [place]/AT, EOM- WNL, Conjuctivae- clear, PERRLA, TM-WNL, Nose- clear, Throat- clear and wnl NECK: Supple w/ fair ROM, JVD- none, normal carotid impulses w/o bruits Thyroid- CHEST: Clear to P&A, unlabored HEART: RRR, no m/g/r heard ABDOMEN:  ZOX:WRUE, nl pulses, no edema  NEURO: Grossly intact to observation, anxious but no tremor.      Impression & Recommendations:  Problem # 1:  INSOMNIA (ICD-780.52)  He is afraid that he might hurt somebody in sleep. At first I thought he was asking for short term sleep med. We reviewed Lunesta in [facility]. He then said he thought he needed short term psych admission. I offered referral to [facility]. He then asked about our Behavioral Health Division. He then called his NP counselor Saul Fordyce and while I was in room, he told them he had gone into a "psychosis", although he seemd cogent and well organized, just anxious..  His updated medication list for this problem includes:    Ambien 10 Mg Tabs (Zolpidem tartrate)  Problem # 2:  ASTHMA (ICD-V17.5)  He has been off prednisone recently but because of the number of times he has needed taper, we began discussion of Xolair. He is travelling back and forth to [place] enough to make more frequent allergy vaccine  less practical.  Medications Added to Medication List This Visit: 1)  Naltrexone Hcl 50 Mg Tabs (Naltrexone hcl) .... Take 1/2 by mouth once daily 2)  Prevacid 30 Mg Cpdr (Lansoprazole) .... Take 1 by mouth once daily 3)  Ambien 10 Mg Tabs (Zolpidem tartrate)  Other Orders: Est. Patient Level III (45409)  Patient Instructions: 1)  Please schedule a follow-up appointment as needed. 2)  Information sheet on Xolair. This would be a consideration because of the amount of cortisone you have needed. 3)  Follow through with your counselors today about the stressfull sleep issues. We could refer you to [facility] if needed. You can also call [facility] if needed for crisis help.

## 2010-04-14 NOTE — Assessment & Plan Note (Signed)
Summary: xolair injection/kcw  Nurse Visit   Allergies: 1)  ! * Antrax Vaccine 2)  ! * Beta 2 Angonist 3)  ! * Spiriva  Medication Administration  Injection # 1:    Medication: Xolair (omalizumab) 150mg     Diagnosis: EXTRINSIC ASTHMA, UNSPECIFIED (ICD-493.00)    Route: SQ    Site: L deltoid    Exp Date: 03/2013    Lot #: [id number]    Mfr: Genetech    Comments: 1.2 ML IN LEFT X 2 300MG  PT WAITED 30 MINS CHARGED [phone]    Patient tolerated injection without complications    Given by: TAMMY SCOTT IN ALLERGY LAB  Orders Added: 1)  Administration xolair injection [id number]   Medication Administration  Injection # 1:    Medication: Xolair (omalizumab) 150mg     Diagnosis: EXTRINSIC ASTHMA, UNSPECIFIED (ICD-493.00)    Route: SQ    Site: L deltoid    Exp Date: 03/2013    Lot #: [id number]    Mfr: Genetech    Comments: 1.2 ML IN LEFT X 2 300MG  PT WAITED 30 MINS CHARGED [phone]    Patient tolerated injection without complications    Given by: TAMMY SCOTT IN ALLERGY LAB  Orders Added: 1)  Administration xolair injection [[id number]]

## 2010-04-16 NOTE — Assessment & Plan Note (Signed)
Summary: Pulmonary OV   Copy to:  [facility] Primary Provider/Referring Provider:  T. Spear  CC:  2 month follow up and Xolair injection. Pt states breathing is better. Pt c/o chest tightness this AM, cough, SOB used Qvar w/o relief, and felt relief after using neb treatment. Pt requesting [facility].  History of Present Illness: Pulmonary OV  34yo WM with severe persistent asthma, chronic sinusitis, severe atopy Hx Etoh abuse, now in detox  May 02, 2009- Rhinosinusitis, asthma Asks about "steroid" side effects. Tells me he had used ambien x 3 years - last taken 2 nights ago on 04/30/09. Last took a prednisone taper from Dr Collins [place] 2 weeks ago. Had gotten phenergan inj 2/14. Claims he was "completely unconcious for a day after that" for N&V/ GI bug shared with his son. Sleep walking night of 2/16- thinks he was dreaming of climbing a mountain. He climbed pull down attic stair, fell, scraping arm and cutting knee. 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 Has had nightmares, was in counseling for awhile, bad trip on Nyquil in past, but never told PTSD. Now he's afraid of sleeping, afraid he might be violent. Family stress with wife. Says no ETOH in long time. 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 Similar event in past when he was drinking and on theophylline. Has been on pred taper 3-5 x/ year. He is taking both Advair and Qvar.    October 03, 2009 9:22 AM Last seen 2/09 for asthma eval Dr young has worked with the patient more recently last seen 2/11 per allergy. The pt saw dr bates and did ear surgery.  this drained the sinuses.  The pt then saw dr young and did not do the allergy injections, he did not f/u on allergy testing Pt now under detox for alcohol abuse. He cannot tol LABAs due to tachycardia and HTN side effects He is on ICS two forms.  He cannot tol steroids.Presents a list  of multiple meds he needs to avoid due to abuse potential Pt still dyspneic and uses SABAs dailySeptember 23, 2011 4:11 PM Pt notes symptoms the same.  Pt still with frequent rescue inhaler use.   Spiriva made pt sleepy, not able to use LABAs.  Has not had xolair eval. Has never been on zyflo  January 20, 2010 9:34 AM overall this patient is improved since starting Xolair therapy. The patient is a less wheezing and cough. There is less shortness of breath. The patient states that the Xolair injection does not last month. The patient is questioning a dosage adjustment. There is no fever, chills, or sweats. There is no chest pain. Overall, the patient is improved. Patient was not able tolerate Zyflo because of  insomnia.   March 25, 2010 11:31 AM This pt has been on xolair  nov and this has helped the pt with the breathing.  Pt has no cough or dyspnea.  No SABA use. Pt denies any significant sore throat, nasal congestion or excess secretions, fever, chills, sweats, unintended weight loss, pleurtic or exertional chest pain, orthopnea PND, or leg swelling Pt denies any increase in rescue therapy over baseline, denies waking up needing it or having any early am or nocturnal exacerbations of coughing/wheezing/or dyspnea.      Asthma History    Asthma Control Assessment:    Age range: 12+ years    Symptoms:  0-2 days/week    Nighttime Awakenings: 0-2/month    Interferes w/ normal activity: no limitations    SABA use (not for EIB): 0-2 days/week    FEV1: 1.99 liters (today)    FEV1 Pred: 4.11 liters (today)    Exacerbations requiring oral systemic steroids: 0-1/year    Asthma Control Assessment: Very Poorly Controlled   Preventive Screening-Counseling & Management  Alcohol-Tobacco     Smoking Status: never  Current Medications (verified): 1)  Albuterol Sulfate (2.5 Mg/68ml) 0.083%  Nebu (Albuterol Sulfate) .... As Needed 2)  Nasonex 50 Mcg/act  Susp (Mometasone Furoate) .... Two Puffs  Each Nostril Daily As Needed 3)  Xyzal 5 Mg Tabs (Levocetirizine Dihydrochloride) .... 2 Once Daily 4)  Qvar 80 Mcg/act  Aers (Beclomethasone Dipropionate) .... 4   Puffs Twice Daily 5)  Proventil Hfa 108 (90 Base) Mcg/act Aers (Albuterol Sulfate) .... 2puffs Four Times A Day Prn 6)  Prevacid 30 Mg Cpdr (Lansoprazole) .... 2 At Bedtime 7)  Pulmicort 0.25 Mg/13ml Susp (Budesonide) .... Once Daily As Needed 8)  Xolair 150 Mg Solr (Omalizumab) .... 300mg  Subcutaneously Monthly 9)  Transderm-Scop 1.5 Mg Pt72 (Scopolamine Base) .Marland Kitchen.. 1 Patch Every 3 Days  Allergies (verified): 1)  ! * Antrax Vaccine 2)  ! * Beta 2 Angonist 3)  ! * Spiriva  Past History:  Past medical, surgical, family and social histories (including risk factors) reviewed, and no changes noted (except as noted below).  Past Medical History: Reviewed history from 12/14/2007 and no changes required. Chronic otitis asthma- Pos Immunocap and Skin tests 06/14/07 shingles sinusitis heat stroke age 41 depression ADHD  Past Surgical History: Reviewed history from 06/20/2008 and no changes required. Septoplasty bilateral tympanoplasty and removal of cholesteatoma  Family History: Reviewed history from 05/04/2007 and no changes required. GF died stomach cancer Father allergic rhinitis, sleep apnea MI/Heart Attack  Social History: Reviewed history from 05/02/2009 and no changes required. 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  The patient denies shortness of breath at rest, productive cough, non-productive cough, coughing up blood, chest pain, irregular heartbeats, acid heartburn, indigestion, loss of appetite, weight change, abdominal pain, difficulty swallowing, sore throat, tooth/dental problems, headaches, nasal congestion/difficulty breathing through nose, sneezing, itching, ear  ache, anxiety, depression, hand/feet swelling, joint stiffness or pain, rash, change in color of mucus, and fever.    Vital Signs:  Patient profile:   35 year old male Height:      68 inches Weight:      196.4 pounds O2 Sat:      96 % on Room air Temp:     98.3 degrees F oral Pulse rate:   73 / minute BP sitting:   118 / 70  (left arm) Cuff size:   large  Vitals Entered By: Zackery Barefoot CMA (March 25, 2010 10:39 AM)  O2 Flow:  Room air CC: 2 month follow up and Xolair injection. Pt states breathing is better. Pt c/o chest tightness this AM, cough, SOB used Qvar w/o relief, felt relief after using neb treatment. Pt requesting refill of [facility] Comments Medications reviewed with patient Verified contact number and pharmacy with patient Zackery Barefoot CMA  March 25, 2010 10:44 AM    Physical Exam  Additional Exam:  General: A/Ox3; pleasant and cooperative, NAD, anxious/ stressed, alert, communicative, coherent SKIN: no  rash, lesions NODES: no lymphadenopathy HEENT: [place]/AT, EOM- WNL, Conjuctivae- clear, PERRLA, TM-WNL, Nose- bilat purulent material in both nares, Throat- clear and wnl NECK: Supple w/ fair ROM, JVD- none, normal carotid impulses w/o bruits Thyroid- CHEST: no wheeze, improved airflow HEART: RRR, no m/g/r heard ABDOMEN:  WJX:BJYN, nl pulses, no edema  NEURO: Grossly intact to observation, anxious but no tremor.      Pre-Spirometry FEV1    Value: 1.99 L     Pred: 4.11 L     Impression & Recommendations:  Problem # 1:  EXTRINSIC ASTHMA, UNSPECIFIED (ICD-493.00) Assessment Improved  Asthma better with xolair  plan cont xolair 300mg  monthly No change in inhaled medications.   Maintain treatment program as currently prescribed.  Medications Added to Medication List This Visit: 1)  Transderm-scop 1.5 Mg Pt72 (Scopolamine base) .Marland Kitchen.. 1 patch every 3 days  Complete Medication List: 1)  Albuterol Sulfate (2.5 Mg/58ml) 0.083% Nebu  (Albuterol sulfate) .... As needed 2)  Nasonex 50 Mcg/act Susp (Mometasone furoate) .... Two puffs each nostril daily as needed 3)  Xyzal 5 Mg Tabs (Levocetirizine dihydrochloride) .... 2 once daily 4)  Qvar 80 Mcg/act Aers (Beclomethasone dipropionate) .... 4   puffs twice daily 5)  Proventil Hfa 108 (90 Base) Mcg/act Aers (Albuterol sulfate) .... 2puffs four times a day prn 6)  Prevacid 30 Mg Cpdr (Lansoprazole) .... 2 at bedtime 7)  Pulmicort 0.25 Mg/83ml Susp (Budesonide) .... Once daily as needed 8)  Xolair 150 Mg Solr (Omalizumab) .... 300mg  subcutaneously monthly 9)  Transderm-scop 1.5 Mg Pt72 (Scopolamine base) .Marland Kitchen.. 1 patch every 3 days  Other Orders: Est. Patient Level III (82956)  Patient Instructions: 1)  No change in medications 2)  Return in     4     months Prescriptions: PULMICORT 0.25 MG/2ML SUSP (BUDESONIDE) once daily as needed  #120 x 4   Entered and Authorized by:   Storm Frisk MD   Signed by:   Storm Frisk MD on 03/25/2010   Method used:   Faxed to ...       [facility] ([facility])       [address]       [place], [place]  [address]       Ph: [phone]       Fax: [phone]   RxID:   [id number] QVAR 80 MCG/ACT  AERS (BECLOMETHASONE DIPROPIONATE) 4   puffs twice daily  #3 x 4   Entered and Authorized by:   Storm Frisk MD   Signed by:   Storm Frisk MD on 03/25/2010   Method used:   Faxed to ...       [facility] (mail[facility]order)       [address]       [place], [place]  [address]       Ph: [phone]       Fax: [phone]   RxID:   [id number]    Appended Document: Pulmonary OV fax tammy spear  Appended Document: Orders Update    Clinical Lists Changes  Orders: Added new Service order of Administration xolair injection [phone]) - Signed       Medication Administration  Injection # 1:    Medication: Xolair (omalizumab) 150mg     Diagnosis:  EXTRINSIC ASTHMA, UNSPECIFIED (ICD-493.00)    Route: SQ    Site: L deltoid    Exp Date: 03/2013    Lot #: [id number]    Mfr: Salome Spotted  Comments: 1.2 ML IN LEFT AND RIGHT ARM 300MG  CHARGED [phone]    Given by: Dimas Millin IN ALLERGY LAB  Orders Added: 1)  Administration xolair injection [phone]

## 2010-04-16 NOTE — Medication Information (Signed)
Summary: Tax adviser   Imported By: Lehman [employer] 04/03/2010 09:14:46  _____________________________________________________________________  External Attachment:    Type:   Image     Comment:   External Document

## 2010-04-22 ENCOUNTER — Encounter: Payer: Self-pay | Admitting: Critical Care Medicine

## 2010-04-22 ENCOUNTER — Ambulatory Visit (INDEPENDENT_AMBULATORY_CARE_PROVIDER_SITE_OTHER)

## 2010-04-22 DIAGNOSIS — J45909 Unspecified asthma, uncomplicated: Secondary | ICD-10-CM

## 2010-04-30 NOTE — Assessment & Plan Note (Signed)
Summary: XOLAIR//SH  Nurse Visit   Allergies: 1)  ! * Antrax Vaccine 2)  ! * Beta 2 Angonist 3)  ! * Spiriva  Medication Administration  Injection # 1:    Medication: Xolair (omalizumab) 150mg     Diagnosis: EXTRINSIC ASTHMA, UNSPECIFIED (ICD-493.00)    Route: SQ    Site: R deltoid    Exp Date: 05/2013    Lot #: [id number]    Mfr: Genetech    Comments: 1.2 ML IN RIGHT AND LEFT ARM 300 MG CHARGED 96401     Given by: Drucie Opitz IN ALLERGY LAB  Orders Added: 1)  Administration xolair injection [id number]   Medication Administration  Injection # 1:    Medication: Xolair (omalizumab) 150mg     Diagnosis: EXTRINSIC ASTHMA, UNSPECIFIED (ICD-493.00)    Route: SQ    Site: R deltoid    Exp Date: 05/2013    Lot #: [id number]    Mfr: Genetech    Comments: 1.2 ML IN RIGHT AND LEFT ARM 300 MG CHARGED 96401     Given by: Drucie Opitz IN ALLERGY LAB  Orders Added: 1)  Administration xolair injection [[id number]]

## 2010-05-14 ENCOUNTER — Encounter: Payer: Self-pay | Admitting: Critical Care Medicine

## 2010-05-14 ENCOUNTER — Ambulatory Visit (INDEPENDENT_AMBULATORY_CARE_PROVIDER_SITE_OTHER)

## 2010-05-14 DIAGNOSIS — J45909 Unspecified asthma, uncomplicated: Secondary | ICD-10-CM

## 2010-05-21 NOTE — Assessment & Plan Note (Signed)
Summary: xolair  Nurse Visit   Allergies: 1)  ! * Antrax Vaccine 2)  ! * Beta 2 Angonist 3)  ! * Spiriva  Medication Administration  Injection # 1:    Medication: Xolair (omalizumab) 150mg     Diagnosis: EXTRINSIC ASTHMA, UNSPECIFIED (ICD-493.00)    Route: SQ    Site: R deltoid    Exp Date: 06/2013    Lot #: [id number]    Mfr: Salome Spotted    Comments: 1.2 ML IN RIGHT AND LEFT ARM 300 MG CHARGED 96401     Given by: Drucie Opitz IN ALLERGY LAB  Orders Added: 1)  Administration xolair injection [[id number]]   Medication Administration  Injection # 1:    Medication: Xolair (omalizumab) 150mg     Diagnosis: EXTRINSIC ASTHMA, UNSPECIFIED (ICD-493.00)    Route: SQ    Site: R deltoid    Exp Date: 06/2013    Lot #: [id number]    Mfr: Salome Spotted    Comments: 1.2 ML IN RIGHT AND LEFT ARM 300 MG CHARGED 96401     Given by: Drucie Opitz IN ALLERGY LAB  Orders Added: 1)  Administration xolair injection [[id number]]

## 2010-06-03 LAB — DIFFERENTIAL
Basophils Absolute: 0 10*3/uL (ref 0.0–0.1)
Basophils Relative: 1 % (ref 0–1)
Lymphocytes Relative: 30 % (ref 12–46)
Monocytes Absolute: 0.7 10*3/uL (ref 0.1–1.0)
Monocytes Relative: 8 % (ref 3–12)

## 2010-06-03 LAB — RAPID URINE DRUG SCREEN, HOSP PERFORMED
Amphetamines: NOT DETECTED
Barbiturates: NOT DETECTED
Cocaine: NOT DETECTED
Opiates: NOT DETECTED
Tetrahydrocannabinol: NOT DETECTED

## 2010-06-03 LAB — CBC
MCHC: 35.2 g/dL (ref 30.0–36.0)
RBC: 5.73 MIL/uL (ref 4.22–5.81)
WBC: 8.5 10*3/uL (ref 4.0–10.5)

## 2010-06-03 LAB — TRICYCLICS SCREEN, URINE: TCA Scrn: NOT DETECTED

## 2010-06-03 LAB — BASIC METABOLIC PANEL
BUN: 7 mg/dL (ref 6–23)
CO2: 29 mEq/L (ref 19–32)
Chloride: 101 mEq/L (ref 96–112)
Creatinine, Ser: 1.03 mg/dL (ref 0.4–1.5)
Glucose, Bld: 108 mg/dL — ABNORMAL HIGH (ref 70–99)

## 2010-06-10 ENCOUNTER — Ambulatory Visit (INDEPENDENT_AMBULATORY_CARE_PROVIDER_SITE_OTHER)

## 2010-06-10 DIAGNOSIS — J45909 Unspecified asthma, uncomplicated: Secondary | ICD-10-CM

## 2010-06-11 MED ORDER — OMALIZUMAB 150 MG ~~LOC~~ SOLR
300.0000 mg | Freq: Once | SUBCUTANEOUS | Status: AC
  Administered 2010-06-10: 300 mg via SUBCUTANEOUS

## 2010-07-08 ENCOUNTER — Ambulatory Visit (INDEPENDENT_AMBULATORY_CARE_PROVIDER_SITE_OTHER)

## 2010-07-08 DIAGNOSIS — J45909 Unspecified asthma, uncomplicated: Secondary | ICD-10-CM

## 2010-07-08 MED ORDER — OMALIZUMAB 150 MG ~~LOC~~ SOLR
300.0000 mg | Freq: Once | SUBCUTANEOUS | Status: AC
  Administered 2010-07-08: 300 mg via SUBCUTANEOUS

## 2010-08-06 ENCOUNTER — Ambulatory Visit (INDEPENDENT_AMBULATORY_CARE_PROVIDER_SITE_OTHER)

## 2010-08-06 DIAGNOSIS — J45909 Unspecified asthma, uncomplicated: Secondary | ICD-10-CM

## 2010-08-07 MED ORDER — OMALIZUMAB 150 MG ~~LOC~~ SOLR
300.0000 mg | Freq: Once | SUBCUTANEOUS | Status: AC
  Administered 2010-08-07: 300 mg via SUBCUTANEOUS

## 2010-09-04 ENCOUNTER — Ambulatory Visit (INDEPENDENT_AMBULATORY_CARE_PROVIDER_SITE_OTHER)

## 2010-09-04 DIAGNOSIS — J45909 Unspecified asthma, uncomplicated: Secondary | ICD-10-CM

## 2010-09-04 MED ORDER — OMALIZUMAB 150 MG ~~LOC~~ SOLR
300.0000 mg | Freq: Once | SUBCUTANEOUS | Status: AC
  Administered 2010-09-04: 300 mg via SUBCUTANEOUS

## 2010-10-07 ENCOUNTER — Ambulatory Visit (INDEPENDENT_AMBULATORY_CARE_PROVIDER_SITE_OTHER)

## 2010-10-07 DIAGNOSIS — J45909 Unspecified asthma, uncomplicated: Secondary | ICD-10-CM

## 2010-10-07 MED ORDER — OMALIZUMAB 150 MG ~~LOC~~ SOLR
300.0000 mg | Freq: Once | SUBCUTANEOUS | Status: AC
  Administered 2010-10-07: 300 mg via SUBCUTANEOUS

## 2010-11-12 ENCOUNTER — Ambulatory Visit (INDEPENDENT_AMBULATORY_CARE_PROVIDER_SITE_OTHER)

## 2010-11-12 DIAGNOSIS — J45909 Unspecified asthma, uncomplicated: Secondary | ICD-10-CM

## 2010-11-13 MED ORDER — OMALIZUMAB 150 MG ~~LOC~~ SOLR
300.0000 mg | Freq: Once | SUBCUTANEOUS | Status: AC
  Administered 2010-11-13: 300 mg via SUBCUTANEOUS

## 2010-12-23 ENCOUNTER — Ambulatory Visit (INDEPENDENT_AMBULATORY_CARE_PROVIDER_SITE_OTHER)

## 2010-12-23 DIAGNOSIS — J45909 Unspecified asthma, uncomplicated: Secondary | ICD-10-CM

## 2010-12-24 DIAGNOSIS — J45909 Unspecified asthma, uncomplicated: Secondary | ICD-10-CM

## 2010-12-24 MED ORDER — OMALIZUMAB 150 MG ~~LOC~~ SOLR
300.0000 mg | Freq: Once | SUBCUTANEOUS | Status: AC
  Administered 2010-12-24: 300 mg via SUBCUTANEOUS

## 2011-01-18 ENCOUNTER — Ambulatory Visit (INDEPENDENT_AMBULATORY_CARE_PROVIDER_SITE_OTHER)

## 2011-01-18 DIAGNOSIS — J45909 Unspecified asthma, uncomplicated: Secondary | ICD-10-CM

## 2011-01-19 DIAGNOSIS — J45909 Unspecified asthma, uncomplicated: Secondary | ICD-10-CM

## 2011-01-19 MED ORDER — OMALIZUMAB 150 MG ~~LOC~~ SOLR
300.0000 mg | Freq: Once | SUBCUTANEOUS | Status: AC
  Administered 2011-01-19: 300 mg via SUBCUTANEOUS

## 2011-02-10 ENCOUNTER — Other Ambulatory Visit: Payer: Self-pay | Admitting: Critical Care Medicine

## 2011-02-26 ENCOUNTER — Ambulatory Visit (INDEPENDENT_AMBULATORY_CARE_PROVIDER_SITE_OTHER)

## 2011-02-26 DIAGNOSIS — J45909 Unspecified asthma, uncomplicated: Secondary | ICD-10-CM

## 2011-02-27 DIAGNOSIS — J45909 Unspecified asthma, uncomplicated: Secondary | ICD-10-CM

## 2011-02-27 MED ORDER — OMALIZUMAB 150 MG ~~LOC~~ SOLR
300.0000 mg | Freq: Once | SUBCUTANEOUS | Status: AC
  Administered 2011-02-27: 300 mg via SUBCUTANEOUS

## 2011-03-15 ENCOUNTER — Other Ambulatory Visit: Payer: Self-pay | Admitting: Otolaryngology

## 2011-03-15 ENCOUNTER — Ambulatory Visit
Admission: RE | Admit: 2011-03-15 | Discharge: 2011-03-15 | Disposition: A | Discharge status: Home / Self Care | Source: Ambulatory Visit | Attending: Otolaryngology | Admitting: Otolaryngology

## 2011-03-15 DIAGNOSIS — J320 Chronic maxillary sinusitis: Secondary | ICD-10-CM

## 2011-03-16 HISTORY — PX: NASAL TURBINATE REDUCTION: SHX2072

## 2011-04-16 ENCOUNTER — Ambulatory Visit (INDEPENDENT_AMBULATORY_CARE_PROVIDER_SITE_OTHER)

## 2011-04-16 DIAGNOSIS — J45909 Unspecified asthma, uncomplicated: Secondary | ICD-10-CM

## 2011-04-16 MED ORDER — OMALIZUMAB 150 MG ~~LOC~~ SOLR
300.0000 mg | Freq: Once | SUBCUTANEOUS | Status: AC
  Administered 2011-04-16: 300 mg via SUBCUTANEOUS

## 2011-05-14 ENCOUNTER — Other Ambulatory Visit: Payer: Self-pay | Admitting: Otolaryngology

## 2011-05-27 ENCOUNTER — Ambulatory Visit (INDEPENDENT_AMBULATORY_CARE_PROVIDER_SITE_OTHER)

## 2011-05-27 DIAGNOSIS — J45909 Unspecified asthma, uncomplicated: Secondary | ICD-10-CM

## 2011-05-27 MED ORDER — OMALIZUMAB 150 MG ~~LOC~~ SOLR
300.0000 mg | Freq: Once | SUBCUTANEOUS | Status: AC
  Administered 2011-05-27: 300 mg via SUBCUTANEOUS

## 2011-05-28 ENCOUNTER — Encounter: Payer: Self-pay | Admitting: Critical Care Medicine

## 2011-05-28 ENCOUNTER — Ambulatory Visit (INDEPENDENT_AMBULATORY_CARE_PROVIDER_SITE_OTHER): Admitting: Critical Care Medicine

## 2011-05-28 VITALS — BP 126/76 | HR 84 | Temp 97.9°F | Ht 68.0 in | Wt 200.0 lb

## 2011-05-28 DIAGNOSIS — J45909 Unspecified asthma, uncomplicated: Secondary | ICD-10-CM

## 2011-05-28 DIAGNOSIS — G4733 Obstructive sleep apnea (adult) (pediatric): Secondary | ICD-10-CM

## 2011-05-28 DIAGNOSIS — Z9989 Dependence on other enabling machines and devices: Secondary | ICD-10-CM | POA: Insufficient documentation

## 2011-05-28 MED ORDER — BUDESONIDE 0.25 MG/2ML IN SUSP: 0.2500 mg | RESPIRATORY_TRACT | Status: DC | PRN

## 2011-05-28 MED ORDER — ALBUTEROL SULFATE HFA 108 (90 BASE) MCG/ACT IN AERS: 2.0000 | INHALATION_SPRAY | RESPIRATORY_TRACT | Status: DC | PRN

## 2011-05-28 MED ORDER — BECLOMETHASONE DIPROPIONATE 80 MCG/ACT IN AERS: 2.0000 | INHALATION_SPRAY | Freq: Two times a day (BID) | RESPIRATORY_TRACT | Status: DC

## 2011-05-28 NOTE — Patient Instructions (Signed)
Referral to [facility] for possible thermoplasty Stay on Qvar or pulmicort twice daily Proair as needed Stay on Xolair Return 6 months

## 2011-05-28 NOTE — Progress Notes (Signed)
Subjective:    Patient ID: Chad Hurley, male    DOB: 02-Apr-1975, 36 y.o.   MRN: [id number]  HPI Pulmonary OV  36yo WM with severe persistent asthma, chronic sinusitis, severe atopy  Hx Etoh abuse, now in detox  May 02, 2009- Rhinosinusitis, asthma  Asks about "steroid" side effects. Tells me he had used ambien x 3 years - last taken 2 nights ago on 04/30/09. Last took a prednisone taper from Dr Collins [place] 2 weeks ago. Had gotten phenergan inj 2/14. Claims he was "completely unconcious for a day after that" for N&V/ GI bug shared with his son.  Sleep walking night of 2/16- thinks he was dreaming of climbing a mountain. He climbed pull down attic stair, fell, scraping arm and cutting knee. 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 Has had nightmares, was in counseling for awhile, bad trip on Nyquil in past, but never told PTSD. Now he's afraid of sleeping, afraid he might be violent. Family stress with wife. Says no ETOH in long time. 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  Similar event in past when he was drinking and on theophylline. Has been on pred taper 3-5 x/ year. He is taking both Advair and Qvar.  October 03, 2009 9:22 AM  Last seen 2/09 for asthma eval  Dr young has worked with the patient more recently last seen 2/11 per allergy.  The pt saw dr bates and did ear surgery. this drained the sinuses. The pt then saw dr young and did not do the allergy injections, he did not f/u on allergy testing  Pt now under detox for alcohol abuse. He cannot tol LABAs due to tachycardia and HTN side effects  He is on ICS two forms. He cannot tol steroids.Presents a list of multiple meds he needs to avoid due to abuse potential  Pt still dyspneic and uses SABAs dailySeptember 23, 2011 4:11 PM  Pt notes symptoms the same. Pt still with frequent rescue inhaler use.  Spiriva made pt sleepy, not able to use LABAs. Has not had  xolair eval. Has never been on zyflo  January 20, 2010 9:34 AM  overall this patient is improved since starting Xolair therapy. The patient is a less wheezing and cough. There is less shortness of breath. The patient states that the Xolair injection does not last month. The patient is questioning a dosage adjustment. There is no fever, chills, or sweats. There is no chest pain. Overall, the patient is improved. Patient was not able tolerate Zyflo because of insomnia.  March 25, 2010 11:31 AM  This pt has been on xolair nov and this has helped the pt with the breathing. Pt has no cough or dyspnea. No SABA use.  Pt denies any significant sore throat, nasal congestion or excess secretions, fever, chills, sweats, unintended weight loss, pleurtic or exertional chest pain, orthopnea PND, or leg swelling  Pt denies any increase in rescue therapy over baseline, denies waking up needing it or having any early am or nocturnal exacerbations of coughing/wheezing/or dyspnea.    05/28/2011 Pt doing well,  Wants to consider thermoplasty  On xolair and doing well with this  Pt denies any significant sore throat, nasal congestion or excess secretions, fever, chills, sweats, unintended weight loss, pleurtic or exertional chest pain, orthopnea PND, or leg swelling Pt denies any increase in rescue therapy over baseline, denies waking up needing it or  having any early am or nocturnal exacerbations of coughing/wheezing/or dyspnea. Pt also denies any obvious fluctuation in symptoms with  weather or environmental change or other alleviating or aggravating factors    Review of Systems 11pt Ros taken and is neg    Objective:   Physical Exam Filed Vitals:   05/28/11 1102  BP: 126/76  Pulse: 84  Temp: 97.9 F (36.6 C)  TempSrc: Oral  Height: 5\' 8"  (1.727 m)  Weight: 200 lb (90.719 kg)  SpO2: 96%    Gen: Pleasant, well-nourished, in no distress,  normal affect  ENT: No lesions,  mouth clear,  oropharynx  clear, no postnasal drip  Neck: No JVD, no TMG, no carotid bruits  Lungs: No use of accessory muscles, no dullness to percussion, clear without rales or rhonchi  Cardiovascular: RRR, heart sounds normal, no murmur or gallops, no peripheral edema  Abdomen: soft and NT, no HSM,  BS normal  Musculoskeletal: No deformities, no cyanosis or clubbing  Neuro: alert, non focal  Skin: Warm, no lesions or rashes  No results found.        Assessment & Plan:   EXTRINSIC ASTHMA, UNSPECIFIED Severe persistent asthma improved with Xolair therapy The patient is in and in exploring bronchial thermplasty Plan Referral to [facility] for possible thermoplasty Stay on Qvar or pulmicort twice daily Proair as needed Stay on Xolair Return 6 months    Updated Medication List Outpatient Encounter Prescriptions as of 05/28/2011  Medication Sig Dispense Refill  . beclomethasone (QVAR) 80 MCG/ACT inhaler Inhale 2 puffs into the lungs 2 (two) times daily. Pt needs appt for more refills.  3 Inhaler  4  . budesonide (PULMICORT) 0.25 MG/2ML nebulizer solution Take 2 mLs (0.25 mg total) by nebulization as needed. Pt needs appt for more refills.  120 mL  4  . omalizumab (XOLAIR) 150 MG injection Inject 300 mg into the skin every 30 (thirty) days.      Marland Kitchen DISCONTD: beclomethasone (QVAR) 80 MCG/ACT inhaler Inhale 2 puffs into the lungs 2 (two) times daily. Pt needs appt for more refills.  1 Inhaler  0  . DISCONTD: budesonide (PULMICORT) 0.25 MG/2ML nebulizer solution Take 2 mLs (0.25 mg total) by nebulization daily. Pt needs appt for more refills.  30 mL  0  . DISCONTD: budesonide (PULMICORT) 0.25 MG/2ML nebulizer solution Take 0.25 mg by nebulization as needed. Pt needs appt for more refills.      Marland Kitchen albuterol (PROVENTIL HFA;VENTOLIN HFA) 108 (90 BASE) MCG/ACT inhaler Inhale 2 puffs into the lungs every 4 (four) hours as needed for wheezing.  2 Inhaler  6  . omalizumab Geoffry Paradise) injection 300 mg

## 2011-05-28 NOTE — Assessment & Plan Note (Signed)
Severe persistent asthma improved with Xolair therapy The patient is in and in exploring bronchial thermplasty Plan Referral to [facility] for possible thermoplasty Stay on Qvar or pulmicort twice daily Proair as needed Stay on Xolair Return 6 months

## 2011-07-05 ENCOUNTER — Ambulatory Visit (INDEPENDENT_AMBULATORY_CARE_PROVIDER_SITE_OTHER)

## 2011-07-05 DIAGNOSIS — J45909 Unspecified asthma, uncomplicated: Secondary | ICD-10-CM

## 2011-07-05 MED ORDER — OMALIZUMAB 150 MG ~~LOC~~ SOLR
300.0000 mg | Freq: Once | SUBCUTANEOUS | Status: AC
  Administered 2011-07-05: 300 mg via SUBCUTANEOUS

## 2011-08-06 ENCOUNTER — Ambulatory Visit (INDEPENDENT_AMBULATORY_CARE_PROVIDER_SITE_OTHER)

## 2011-08-06 DIAGNOSIS — J45909 Unspecified asthma, uncomplicated: Secondary | ICD-10-CM

## 2011-08-06 MED ORDER — OMALIZUMAB 150 MG ~~LOC~~ SOLR
300.0000 mg | Freq: Once | SUBCUTANEOUS | Status: AC
  Administered 2011-08-06: 300 mg via SUBCUTANEOUS

## 2011-08-27 ENCOUNTER — Telehealth: Payer: Self-pay | Admitting: Critical Care Medicine

## 2011-08-27 NOTE — Telephone Encounter (Signed)
I spoke with [facility] and was advised they are going to ship out pt's xolair on 08/31/11. Will forward to Tammy scott so she is aware

## 2011-08-31 ENCOUNTER — Ambulatory Visit (INDEPENDENT_AMBULATORY_CARE_PROVIDER_SITE_OTHER)

## 2011-08-31 DIAGNOSIS — J45909 Unspecified asthma, uncomplicated: Secondary | ICD-10-CM

## 2011-09-01 DIAGNOSIS — J45909 Unspecified asthma, uncomplicated: Secondary | ICD-10-CM

## 2011-09-01 MED ORDER — OMALIZUMAB 150 MG ~~LOC~~ SOLR
300.0000 mg | Freq: Once | SUBCUTANEOUS | Status: AC
  Administered 2011-09-01: 300 mg via SUBCUTANEOUS

## 2011-09-06 ENCOUNTER — Encounter: Payer: Self-pay | Admitting: Critical Care Medicine

## 2011-09-29 ENCOUNTER — Ambulatory Visit (INDEPENDENT_AMBULATORY_CARE_PROVIDER_SITE_OTHER)

## 2011-09-29 DIAGNOSIS — J45909 Unspecified asthma, uncomplicated: Secondary | ICD-10-CM

## 2011-09-30 MED ORDER — OMALIZUMAB 150 MG ~~LOC~~ SOLR
300.0000 mg | Freq: Once | SUBCUTANEOUS | Status: AC
  Administered 2011-09-30: 300 mg via SUBCUTANEOUS

## 2011-10-04 ENCOUNTER — Telehealth: Payer: Self-pay | Admitting: Critical Care Medicine

## 2011-10-05 NOTE — Telephone Encounter (Signed)
Called, spoke with Dr[facility] Gus Puma with [facility].  States pt was put on xolair - he is not sure who is managing this as pt has seen Dr. Delford Field and Dr. Maple Hudson in the past.  Also, states pt is on zyflo.  Pt is trying to get these meds approved thru the [place] so they will pay for them.  Dr. Paticia Stack is requesting a letter from either Dr. Delford Field, Dr. Maple Hudson, or both (whoever is managing pt's meds) regarding both medications.  He would like the letters to briefly state pt's situation, why he needs these meds, and for the zyflo why the alternative meds are not appropriate.  He would like these letters mailed and faxed to his attn  Fax # [phone]  Address:  Gus Puma, MD       Pulmonary Medicine [facility])       [facility]      [address]       [place], [place] [address]  As pt was last seen by Dr. Delford Field in March 2013, will send msg to him.  Looks like he was last seen by Dr. Maple Hudson in 2011.  Dr. Delford Field, pls advise.  Thank you.  Note:  Zyflo is not on pt's current med list per our pt chart.

## 2011-10-06 NOTE — Telephone Encounter (Signed)
i have never Rx zyflo for this pt  I have rx xolair.  Crystal, will speak to you re this letter content

## 2011-10-12 ENCOUNTER — Encounter: Payer: Self-pay | Admitting: *Deleted

## 2011-10-12 NOTE — Telephone Encounter (Signed)
Letter for Geoffry Paradise has been completed and signed by Dr. Delford Field.  I faxed this and placed in mail as requested below.    Called, spoke with Dr. Paticia Stack. I informed him of below per Dr. Delford Field.  He is aware I have faxed letter for xolair and placed in mail as well.  He will call pt to discuss the zyflo and will call office back if anything further is needed.

## 2011-11-01 ENCOUNTER — Ambulatory Visit

## 2011-11-01 ENCOUNTER — Ambulatory Visit (INDEPENDENT_AMBULATORY_CARE_PROVIDER_SITE_OTHER)

## 2011-11-01 DIAGNOSIS — J45909 Unspecified asthma, uncomplicated: Secondary | ICD-10-CM

## 2011-11-02 MED ORDER — OMALIZUMAB 150 MG ~~LOC~~ SOLR
300.0000 mg | Freq: Once | SUBCUTANEOUS | Status: AC
  Administered 2011-11-02: 300 mg via SUBCUTANEOUS

## 2011-11-08 ENCOUNTER — Other Ambulatory Visit: Payer: Self-pay | Admitting: Critical Care Medicine

## 2011-11-22 ENCOUNTER — Other Ambulatory Visit: Payer: Self-pay | Admitting: Otolaryngology

## 2011-11-29 ENCOUNTER — Ambulatory Visit (INDEPENDENT_AMBULATORY_CARE_PROVIDER_SITE_OTHER)

## 2011-11-29 ENCOUNTER — Telehealth: Payer: Self-pay | Admitting: Critical Care Medicine

## 2011-11-29 DIAGNOSIS — J45909 Unspecified asthma, uncomplicated: Secondary | ICD-10-CM

## 2011-11-29 MED ORDER — OMALIZUMAB 150 MG ~~LOC~~ SOLR
300.0000 mg | Freq: Once | SUBCUTANEOUS | Status: AC
  Administered 2011-11-29: 300 mg via SUBCUTANEOUS

## 2011-11-29 NOTE — Telephone Encounter (Signed)
Dr. Raquel James aware we do not have the pt on Zyflo and this is not listed as one of his current medications.  Dr. Raquel James is trying to help the pt get his Xolair through the [place] and needs a letter from Dr. Delford Field and/or Dr. Maple Hudson. There are some specific criteria that must be in the letter and Dr,. Raquel James will be faxing this to the triage fax. I will hold this m,sg until this information is received and then pass this on th Dr. Delford Field, whom has done a letter for this in the past per Dr. Burt Ek.

## 2011-11-30 ENCOUNTER — Ambulatory Visit: Admitting: Critical Care Medicine

## 2011-12-01 NOTE — Telephone Encounter (Signed)
I spoke with Dr. Raquel James and stated he has not had a chance to fax this yet but stated he will fax this later this afternoon. He was very appreciative of [place] calling back and checking on this. Will await fax.

## 2011-12-01 NOTE — Telephone Encounter (Signed)
noted 

## 2011-12-01 NOTE — Telephone Encounter (Signed)
Criteria information received from Dr. Raquel James and this was placed in Dr. Lynelle Doctor look at for his review.

## 2011-12-02 NOTE — Telephone Encounter (Signed)
As stated below, this has already been placed in the mail.  Will sign off.

## 2011-12-02 NOTE — Telephone Encounter (Signed)
Dr. Raquel James is aware of this. He also asked that we mail him a hard copy as well. He stted on the fax cover sheet he sent the fax with has his address on it. Will forward to Crystal to do so.

## 2011-12-02 NOTE — Telephone Encounter (Signed)
Questions on xolair form completed by Dr. Delford Field.  I have faxed this back to Dr. Paticia Stack at [phone] and placed a copy of this in the mail to address given on fax.  Original placed in Dr. Lynelle Doctor scan folder.  Will you pls let Dr. Paticia Stack know this?  Thank you.

## 2011-12-02 NOTE — Telephone Encounter (Signed)
Dr. Raquel James notified that the information he requested has been faxed and he will call back if there is anything else he is needing.

## 2011-12-07 ENCOUNTER — Telehealth: Payer: Self-pay | Admitting: Critical Care Medicine

## 2011-12-07 ENCOUNTER — Ambulatory Visit: Admitting: Critical Care Medicine

## 2011-12-07 NOTE — Telephone Encounter (Signed)
Dr. Paticia Stack (correct spelling- verified with dr.) needs nurse to re-fax the info that she faxed 3 days ago re: Chad Hurley- he also states that the [facility] pharmacy requires allergy testing results that were  done before pt was put on xolair- also needs ITE results? (please verify with dr when you call him back for more details). He asks that this be faxed to his attn at the same fax # given previously. [name]

## 2011-12-07 NOTE — Telephone Encounter (Signed)
Called and spoke with Dr. Paticia Stack. He states needs to have [place] fax allergy lab results back to him and also include the past form that he sent for PW to fill out. The form was printed, as well as the serum IGE and full allergy profile from 12/05/09 were faxed to him at [phone]. Spoke with him to confirm this is all that was needed. He states nothing further needed.

## 2011-12-08 ENCOUNTER — Telehealth: Payer: Self-pay | Admitting: Critical Care Medicine

## 2011-12-08 NOTE — Telephone Encounter (Signed)
Spoke with Dr.Plitman and notified of xolair dose and our mailing address. He states nothing further needed.

## 2011-12-08 NOTE — Telephone Encounter (Signed)
PW, before I call him back, just want to be sure correct dose is 300 mg every 30 days. Please advise, thanks!

## 2011-12-08 NOTE — Telephone Encounter (Signed)
Yes , that is the dose

## 2011-12-17 ENCOUNTER — Telehealth: Payer: Self-pay | Admitting: Critical Care Medicine

## 2011-12-17 NOTE — Telephone Encounter (Signed)
Per Dr. Raquel James, he is needing documented clarification on how much Xolair pt is getting each time. He says this can be a record of where the pt comes in for his injection here at the office. I will speak with Dimas Millin in the allergy lab and get a copy of pt's injection record. Dr. Raquel James just wants to make sure the pt receives the correct dosage as he has been getting.  I have gathered documentation from our allergy lab and this has been faxed to Dr. Raquel James at his request. I will also notify him via phone call so he is aware and will hold onto this information just in case he does not receive the fax on Monday when he gets back to the office.

## 2011-12-24 ENCOUNTER — Ambulatory Visit: Admitting: Critical Care Medicine

## 2011-12-29 ENCOUNTER — Ambulatory Visit (INDEPENDENT_AMBULATORY_CARE_PROVIDER_SITE_OTHER): Admitting: Critical Care Medicine

## 2011-12-29 ENCOUNTER — Ambulatory Visit (INDEPENDENT_AMBULATORY_CARE_PROVIDER_SITE_OTHER)

## 2011-12-29 ENCOUNTER — Encounter: Payer: Self-pay | Admitting: Critical Care Medicine

## 2011-12-29 ENCOUNTER — Other Ambulatory Visit (INDEPENDENT_AMBULATORY_CARE_PROVIDER_SITE_OTHER)

## 2011-12-29 ENCOUNTER — Encounter: Payer: Self-pay | Admitting: *Deleted

## 2011-12-29 VITALS — BP 116/82 | HR 94 | Temp 98.0°F | Ht 68.0 in | Wt 191.8 lb

## 2011-12-29 DIAGNOSIS — R892 Abnormal level of other drugs, medicaments and biological substances in specimens from other organs, systems and tissues: Secondary | ICD-10-CM

## 2011-12-29 DIAGNOSIS — J45909 Unspecified asthma, uncomplicated: Secondary | ICD-10-CM

## 2011-12-29 LAB — HEPATIC FUNCTION PANEL
ALT: 38 U/L (ref 0–53)
Albumin: 3.8 g/dL (ref 3.5–5.2)
Total Bilirubin: 1 mg/dL (ref 0.3–1.2)

## 2011-12-29 MED ORDER — ZILEUTON ER 600 MG PO TB12: 1200.0000 mg | ORAL_TABLET | Freq: Two times a day (BID) | ORAL | Status: DC

## 2011-12-29 NOTE — Assessment & Plan Note (Addendum)
Severe persistent asthma with significant atopic features improved on Xolair therapy and Zyflo Inconsistent use of inhaled steroids Plan Maintain Zyflo 1200 mg twice daily Cont Xolair monthly Albuterol as needed Obtain liver function profile Return 6 months

## 2011-12-29 NOTE — Patient Instructions (Addendum)
No change in medications. Return in             6 months  Liver function profile today

## 2011-12-29 NOTE — Progress Notes (Signed)
Subjective:    Patient ID: Chad Hurley, male    DOB: 04-17-1975, 36 y.o.   MRN: [id number]  HPI  Pulmonary OV   36 y.o.  WM with severe persistent asthma, chronic sinusitis, severe atopy  Hx Etoh abuse, now off alcohol  05/28/11 Pt doing well,  Wants to consider thermoplasty  On xolair and doing well with this  Pt denies any significant sore throat, nasal congestion or excess secretions, fever, chills, sweats, unintended weight loss, pleurtic or exertional chest pain, orthopnea PND, or leg swelling Pt denies any increase in rescue therapy over baseline, denies waking up needing it or having any early am or nocturnal exacerbations of coughing/wheezing/or dyspnea. Pt also denies any obvious fluctuation in symptoms with  weather or environmental change or other alleviating or aggravating factors  12/29/2011 Since the last office visit the patient has undergone turbinate reduction and sinus surgery per ENT. The patient did go to [facility] and they declined to pursue femoroplasty in this patient's case. The patient is doing well on Xolair but has also been started on Zyflo. The patient's been inconsistent with use of inhaled steroids. The patient denies frequent albuterol use. The patient's symptom complex has been well controlled recently.  PUL ASTHMA HISTORY 12/29/2011  Symptoms >2 days/week  Nighttime awakenings 0-2/month  Interference with activity Minor limitations  SABA use 0-2 days/wk  Exacerbations requiring oral steroids 0-1 / year      Review of Systems  Constitutional:   No  weight loss, night sweats,  Fevers, chills, fatigue, lassitude. HEENT:   No headaches,  Difficulty swallowing,  Tooth/dental problems,  Sore throat,                No sneezing, itching, ear ache, ++nasal congestion, post nasal drip,   CV:  No chest pain,  Orthopnea, PND, swelling in lower extremities, anasarca, dizziness, palpitations  GI  No heartburn, indigestion, abdominal pain, nausea,  vomiting, diarrhea, change in bowel habits, loss of appetite  Resp: No shortness of breath with exertion or at rest.  No excess mucus, no productive cough,  No non-productive cough,  No coughing up of blood.  No change in color of mucus.  No wheezing.  No chest wall deformity  Skin: no rash or lesions.  GU: no dysuria, change in color of urine, no urgency or frequency.  No flank pain.  MS:  No joint pain or swelling.  No decreased range of motion.  No back pain.  Psych:  No change in mood or affect. No depression or anxiety.  No memory loss.     Objective:   Physical Exam  Filed Vitals:   12/29/11 0911  BP: 116/82  Pulse: 94  Temp: 98 F (36.7 C)  TempSrc: Oral  Height: 5\' 8"  (1.727 m)  Weight: 191 lb 12.8 oz (87 kg)  SpO2: 96%    Gen: Pleasant, well-nourished, in no distress,  normal affect  ENT: No lesions,  mouth clear,  oropharynx clear,++  postnasal drip, turbinate edema postoperative changes  Neck: No JVD, no TMG, no carotid bruits  Lungs: No use of accessory muscles, no dullness to percussion, few expiratory wheezes  Cardiovascular: RRR, heart sounds normal, no murmur or gallops, no peripheral edema  Abdomen: soft and NT, no HSM,  BS normal  Musculoskeletal: No deformities, no cyanosis or clubbing  Neuro: alert, non focal  Skin: Warm, no lesions or rashes          Assessment & Plan:  Extrinsic asthma, unspecified Severe persistent asthma with significant atopic features improved on Xolair therapy and Zyflo Inconsistent use of inhaled steroids Plan Maintain Zyflo 1200 mg twice daily Cont Xolair monthly Albuterol as needed Obtain liver function profile Return 6 months    Updated Medication List Outpatient Encounter Prescriptions as of 12/29/2011  Medication Sig Dispense Refill  . albuterol (PROVENTIL HFA;VENTOLIN HFA) 108 (90 BASE) MCG/ACT inhaler Inhale 2 puffs into the lungs every 4 (four) hours as needed for wheezing.  2 Inhaler  6  .  omalizumab (XOLAIR) 150 MG injection Inject 300 mg into the skin every 30 (thirty) days.      . zileuton (ZYFLO CR) 600 MG CR tablet Take 2 tablets (1,200 mg total) by mouth 2 (two) times daily.  360 tablet  4  . DISCONTD: zileuton (ZYFLO CR) 600 MG CR tablet Take 1,200 mg by mouth 2 (two) times daily.      Marland Kitchen DISCONTD: beclomethasone (QVAR) 80 MCG/ACT inhaler Inhale 2 puffs into the lungs 2 (two) times daily. Pt needs appt for more refills.  3 Inhaler  4  . DISCONTD: PULMICORT 0.25 MG/2ML nebulizer solution USE 1 VIAL VIA NEBULIZER DAILY  120 mL  0

## 2011-12-30 MED ORDER — OMALIZUMAB 150 MG ~~LOC~~ SOLR
300.0000 mg | Freq: Once | SUBCUTANEOUS | Status: AC
  Administered 2011-12-30: 300 mg via SUBCUTANEOUS

## 2012-01-26 ENCOUNTER — Ambulatory Visit

## 2012-01-27 ENCOUNTER — Telehealth: Payer: Self-pay | Admitting: *Deleted

## 2012-01-27 NOTE — Telephone Encounter (Signed)
Noted   I assumed so  Let our injection dept know not to order xolair for him, he will be going to [facility] for injections/supplies

## 2012-01-27 NOTE — Telephone Encounter (Signed)
Will route to Tammy S as FYI.

## 2012-01-27 NOTE — Telephone Encounter (Signed)
I called the pharmacy twice "We have to have the pt.'s permission to ship xolair". I called the pt. Twice no response. The third time I called the pharmacy they told me he will be getting his shots from the [facility].. Just thought you should know.  Nita Sickle

## 2012-01-27 NOTE — Telephone Encounter (Signed)
Aware. 

## 2012-01-27 NOTE — Telephone Encounter (Signed)
Will route to PW as FYI.  

## 2012-05-02 ENCOUNTER — Telehealth: Payer: Self-pay | Admitting: Critical Care Medicine

## 2012-05-02 NOTE — Telephone Encounter (Signed)
I spoke with pt. He stated the [place] is not want to fill the zyflo for him. He is needing Dr. Delford Field to refill this for [facility] day [facility]. Also he states the [place] is needing another letting stating the medication pt has tried and failed being the reason pt needs to be on zyflo before they will fill this for pt. Please advise Dr. Delford Field thanks

## 2012-05-02 NOTE — Telephone Encounter (Signed)
i am ok with refill and to send letter re the xyflo

## 2012-05-04 ENCOUNTER — Encounter: Payer: Self-pay | Admitting: *Deleted

## 2012-05-04 MED ORDER — ZILEUTON ER 600 MG PO TB12: 1200.0000 mg | ORAL_TABLET | Freq: Two times a day (BID) | ORAL | Status: DC

## 2012-05-04 NOTE — Telephone Encounter (Signed)
Called, spoke with pt.  He is aware I have sent zyflo cr 600 mg 2 po bid rx to [facility].   Pt states the medications he has tried without success are advair, pulmicort inhaler, singulair, duoneb, and theophyline.  Pt also states he is allergic to long acting beta 2 agonists and would like this included in letter.  Advised I would type letter up, have PW review and sign, and call him when ready.  He would like to pick letter up when this is ready.    Letter has been completed.  I will have PW sign and review this evening.

## 2012-05-05 NOTE — Telephone Encounter (Signed)
Letter ready for pick up and placed at front.  lmomtcb to inform pt.

## 2012-05-08 NOTE — Telephone Encounter (Signed)
LMTCBx2. I tried emergency contact number and it was disconnected. Carron Curie, CMA

## 2012-05-09 NOTE — Telephone Encounter (Signed)
Checked up front and letter has been picked up.

## 2012-05-29 ENCOUNTER — Telehealth: Payer: Self-pay | Admitting: Critical Care Medicine

## 2012-05-29 ENCOUNTER — Encounter: Payer: Self-pay | Admitting: *Deleted

## 2012-05-29 NOTE — Telephone Encounter (Signed)
Pt informed that letter was ready to be picked up at front desk.

## 2012-05-29 NOTE — Telephone Encounter (Signed)
i am ok with this dose change

## 2012-05-29 NOTE — Telephone Encounter (Signed)
Pt is receiving Xolair through Dr Paticia Stack at the [facility] clinic now.  He is wanting to increase the Xolair to every 2 weeks and would like to get a letter from Dr Delford Field stating that this is okay.  Pt states that this can be addressed to Dr Paticia Stack and pt will pick it up and deliver it to him. Pt aware that PW is out of office and it may be today or tomorrow.

## 2012-06-13 ENCOUNTER — Telehealth: Payer: Self-pay | Admitting: Critical Care Medicine

## 2012-06-13 NOTE — Telephone Encounter (Signed)
lmomtcb x1 for Mary 

## 2012-06-14 NOTE — Telephone Encounter (Signed)
Chad Hurley wanted to clarify xolair dose change. According to phone note from 05-29-12 pt called and requested a letter stating dose changed from 300mg  monthly to every 2 weeks and per PW he was ok with this dose change. Chad Hurley advised. Chad Hurley, CMA

## 2012-06-19 ENCOUNTER — Telehealth: Payer: Self-pay | Admitting: Critical Care Medicine

## 2012-06-19 NOTE — Telephone Encounter (Signed)
Spoke with Dr Raquel James He states that he received letter from PW from 05/29/12 stating okay to increase xolair  He was asking why the increase was needed, and I advised from what I can tell, the pt had requested this He states that he is going to need more concrete reasons why med needs to be increased so that the pharmacy committee will approve this request We would like for PW to give him a call to discuss this sometime I have called the pt to make him a f/u since he is due, but had to lmtcb  Please advise thanks

## 2012-06-19 NOTE — Telephone Encounter (Signed)
Dr Delford Field, when I spoke with Dr Angelena Sole, he stated that he only sees the pt every 6 months, and did not know anything about increasing xolair until he got a letter from you If you look at the phone note dated 3/17, looks like the pt requested the increase in med Would you like for me to call Dr. Angelena Sole and tell him we are not changing the dose?

## 2012-06-19 NOTE — Telephone Encounter (Signed)
That is exactly what i said in the last msg

## 2012-06-19 NOTE — Telephone Encounter (Signed)
Pt returned call.  Set an appt w/ PW for 5/23.  Antionette Fairy

## 2012-06-19 NOTE — Telephone Encounter (Signed)
Wait a second  I was responding to [facility] request,  This is NOT something I have asked for!!!!!

## 2012-06-20 NOTE — Telephone Encounter (Signed)
LMTCB for Dr Angelena Sole

## 2012-06-21 NOTE — Telephone Encounter (Signed)
Called, spoke with Dr. Angelena Sole.  I informed him we are not changing pt's xolair dosage per PW.  He verbalized understanding.  Dr. Angelena Sole had a few other concerns: 1.  Pt was seen by PW 6 months ago.  He is due for a follow up, which is scheduled on May 23.  Dr. Angelena Sole states during next OV pt needs to discuss with PW his concerns about the xolair dosage and settle this with PW.  Once this is settled, our office needs to communicate this with Dr. Angelena Sole.  Pt has an OV scheduled on May 23 with PW.  Dr. Angelena Sole states pt tells him he is worsening.  Advised Dr. Angelena Sole I would call pt to see if he would like to move appt up.  He was very appreciative of this. 2.  [facility] get [facility] to [facility].  Our office sent a letter to Dr. Angelena Sole regarding Zyflo.  Dr. Angelena Sole states this needs to be more detailed in order for [facility] to pay for this.  Requesting pt and PW discuss this as well during OV.  Dr. Angelena Sole states Dr. Delford Field may call him regarding pt if needed --- [phone]  Called, spoke with pt.  Pt states he is currently taking xolair every 4 wks but this seems to be wearing off around wk 3.  He would like to come in prior to May appt to discuss everything with PW.  Appt scheduled for tomorrow, April 10 at 10 am in [place].  Pt aware and was given [place] address and phone #.    Will sign off on msg and route to PW as FYI.

## 2012-06-21 NOTE — Telephone Encounter (Signed)
Noted  Hold on to this note for ov

## 2012-06-22 ENCOUNTER — Encounter: Payer: Self-pay | Admitting: Critical Care Medicine

## 2012-06-22 ENCOUNTER — Ambulatory Visit (INDEPENDENT_AMBULATORY_CARE_PROVIDER_SITE_OTHER): Admitting: Critical Care Medicine

## 2012-06-22 VITALS — BP 144/92 | HR 87 | Temp 97.9°F | Ht 68.0 in | Wt 194.0 lb

## 2012-06-22 DIAGNOSIS — J45909 Unspecified asthma, uncomplicated: Secondary | ICD-10-CM

## 2012-06-22 DIAGNOSIS — J45902 Unspecified asthma with status asthmaticus: Secondary | ICD-10-CM

## 2012-06-22 DIAGNOSIS — J019 Acute sinusitis, unspecified: Secondary | ICD-10-CM

## 2012-06-22 LAB — CBC
Hemoglobin: 18.4 g/dL — ABNORMAL HIGH (ref 13.0–17.0)
MCH: 31.5 pg (ref 26.0–34.0)
Platelets: 333 10*3/uL (ref 150–400)
RBC: 5.84 MIL/uL — ABNORMAL HIGH (ref 4.22–5.81)
WBC: 22.7 10*3/uL — ABNORMAL HIGH (ref 4.0–10.5)

## 2012-06-22 MED ORDER — METHYLPREDNISOLONE (PAK) 4 MG PO TABS: 4.0000 mg | ORAL_TABLET | Freq: Every day | ORAL | Status: DC

## 2012-06-22 MED ORDER — BECLOMETHASONE DIPROPIONATE 80 MCG/ACT IN AERS: 2.0000 | INHALATION_SPRAY | Freq: Two times a day (BID) | RESPIRATORY_TRACT | Status: DC

## 2012-06-22 MED ORDER — MOXIFLOXACIN HCL 400 MG PO TABS: 400.0000 mg | ORAL_TABLET | Freq: Every day | ORAL | Status: DC

## 2012-06-22 NOTE — Progress Notes (Signed)
Subjective:    Patient ID: Margretta Ditty, male    DOB: 1975-10-31, 37 y.o.   MRN: [id number]  HPI  Pulmonary OV   37 y.o.  WM with severe persistent asthma, chronic sinusitis, severe atopy  Hx Etoh abuse, now off alcohol  12/29/2011 Since the last office visit the patient has undergone turbinate reduction and sinus surgery per ENT. The patient did go to [facility] and they declined to pursue thermoplasty in this patient's case. The patient is doing well on Xolair but has also been started on Zyflo. The patient's been inconsistent with use of inhaled steroids. The patient denies frequent albuterol use. The patient's symptom complex has been well controlled recently.  06/22/2012 Pt was on xolair 300mg   Monthly.   Getting [employer] coverage Tried and failed singulair and accolate. Did not work Oncologist and helped.  zyflo at 2400mg     1200mg  bid  No real cough or mucus.  Lungs feel wet now.  Pt on decadron 8mg /d.  Just started one week ago. Augmentin.  4mg  decadron given IM one week ago.   Still on augmentin, Rx 10days Sinuses still an issue   PUL ASTHMA HISTORY 06/22/2012 12/29/2011  Symptoms Throughout the day >2 days/week  Nighttime awakenings >1/wk but not nightly 0-2/month  Interference with activity Some limitations Minor limitations  SABA use Daily 0-2 days/wk  Exacerbations requiring oral steroids 2 or more / year 0-1 / year      Review of Systems  Constitutional:   No  weight loss, night sweats,  Fevers, chills, fatigue, lassitude. HEENT:   No headaches,  Difficulty swallowing,  Tooth/dental problems,  Sore throat,                No sneezing, itching, ear ache, ++nasal congestion, post nasal drip,   CV:  No chest pain,  Orthopnea, PND, swelling in lower extremities, anasarca, dizziness, palpitations  GI  No heartburn, indigestion, abdominal pain, nausea, vomiting, diarrhea, change in bowel habits, loss of appetite  Resp: No shortness of breath with exertion  or at rest.  No excess mucus, no productive cough,  No non-productive cough,  No coughing up of blood.  No change in color of mucus.  No wheezing.  No chest wall deformity  Skin: no rash or lesions.  GU: no dysuria, change in color of urine, no urgency or frequency.  No flank pain.  MS:  No joint pain or swelling.  No decreased range of motion.  No back pain.  Psych:  No change in mood or affect. No depression or anxiety.  No memory loss.       Objective:   Physical Exam  Filed Vitals:   06/22/12 0925  BP: 144/92  Pulse: 87  Temp: 97.9 F (36.6 C)  TempSrc: Oral  Height: 5\' 8"  (1.727 m)  Weight: 194 lb (87.998 kg)  SpO2: 95%    Gen: Pleasant, well-nourished, in no distress,  normal affect  ENT: No lesions,  mouth clear,  oropharynx clear,++  postnasal drip, turbinate edema postoperative changes, mild purulence  Neck: No JVD, no TMG, no carotid bruits  Lungs: No use of accessory muscles, no dullness to percussion, few expiratory wheezes  Cardiovascular: RRR, heart sounds normal, no murmur or gallops, no peripheral edema  Abdomen: soft and NT, no HSM,  BS normal  Musculoskeletal: No deformities, no cyanosis or clubbing  Neuro: alert, non focal  Skin: Warm, no lesions or rashes      Assessment &  Plan:   Severe persistent asthma with atopic features Severe persistent asthma with significant atopic features Inability to tolerate beta agonist and any inhaled steroids Demonstrated failure with Singulair and Accolate but positive response to Zyflo IgE levels and patient weight suggest appropriate dosing of Xolair to be 300 mg once a month The increased frequency of Xolair was being suggested by the patient and not by the [facility] medical Dr., this information was not completely clear to me based on the telephone notes I was receiving from staff  At this point I would recommend continuing Xolair 300 mg once a month and I believe the [facility] should  attempt to cover the Zyflo as  it is the only leukotriene receptor antagonist this patient is demonstrated to improve  The patient is having recurrent exacerbation of asthma and would benefit from systemic steroids there is also mild sinusitis which is slowly improving on Augmentin  the patient currently is on Decadron and I would prefer Medrol in this case  Plan Discontinue Decadron Finish course of Augmentin Supplement with 5 days of Avelox 4 mg daily Change to Medrol 4 mg to take 4 a day for 3 days then reduce by one every 3 days to off Add Qvar 80 mcg strength 2 puffs twice daily Continue Zyflo 2400 mg daily and obtain liver function profile and CBC Continue Xolair 300 mg every month and obtain IgE level Return 2 months    Updated Medication List Outpatient Encounter Prescriptions as of 06/22/2012  Medication Sig Dispense Refill  . amoxicillin-clavulanate (AUGMENTIN) 875-125 MG per tablet Take 1 tablet by mouth 2 (two) times daily.      Marland Kitchen ipratropium-albuterol (DUONEB) 0.5-2.5 (3) MG/3ML SOLN Take 3 mLs by nebulization as needed.      . mometasone (NASONEX) 50 MCG/ACT nasal spray Place 2 sprays into the nose daily.      Marland Kitchen omalizumab (XOLAIR) 150 MG injection Inject 300 mg into the skin every 30 (thirty) days.      . zileuton (ZYFLO CR) 600 MG CR tablet Take 2 tablets (1,200 mg total) by mouth 2 (two) times daily.  360 tablet  1  . [DISCONTINUED] dexamethasone (DECADRON) 4 MG tablet Take 4 mg by mouth 2 (two) times daily with a meal.      . beclomethasone (QVAR) 80 MCG/ACT inhaler Inhale 2 puffs into the lungs 2 (two) times daily.  1 Inhaler  1  . methylPREDNIsolone (MEDROL DOSPACK) 4 MG tablet Take 1 tablet (4 mg total) by mouth daily. Take 4 for three days 3 for three days 2 for three days 1 for three days and stop  60 tablet  6  . moxifloxacin (AVELOX) 400 MG tablet Take 1 tablet (400 mg total) by mouth daily.  5 tablet  0   No facility-administered encounter medications on file as of 06/22/2012.

## 2012-06-22 NOTE — Assessment & Plan Note (Signed)
Severe persistent asthma with significant atopic features Inability to tolerate beta agonist and any inhaled steroids Demonstrated failure with Singulair and Accolate but positive response to Zyflo IgE levels and patient weight suggest appropriate dosing of Xolair to be 300 mg once a month The increased frequency of Xolair was being suggested by the patient and not by the [facility] medical Dr., this information was not completely clear to me based on the telephone notes I was receiving from staff  At this point I would recommend continuing Xolair 300 mg once a month and I believe the [facility] should  attempt to cover the Zyflo as it is the only leukotriene receptor antagonist this patient is demonstrated to improve  The patient is having recurrent exacerbation of asthma and would benefit from systemic steroids there is also mild sinusitis which is slowly improving on Augmentin  the patient currently is on Decadron and I would prefer Medrol in this case  Plan Discontinue Decadron Finish course of Augmentin Supplement with 5 days of Avelox 4 mg daily Change to Medrol 4 mg to take 4 a day for 3 days then reduce by one every 3 days to off Add Qvar 80 mcg strength 2 puffs twice daily Continue Zyflo 2400 mg daily and obtain liver function profile and CBC Continue Xolair 300 mg every month and obtain IgE level Return 2 months

## 2012-06-22 NOTE — Patient Instructions (Signed)
Stop decadron Start Medrol 4mg  Take 4 for three days 3 for three days 2 for three days 1 for three days and stop Finish augmentin then start avelox one daily for 5 days Start Qvar two puff twice daily (use samples til gone) Stay on Xolair 300mg  monthly Stay on zyflo twice daily, will send letter to [facility] Return 2 months Labs today

## 2012-06-26 NOTE — Telephone Encounter (Signed)
Pt was seen by PW on 06/22/12. I spoke with Dr. Angelena Sole and have faxed a copy of OV note to [phone] and [phone] per his request.

## 2012-06-29 ENCOUNTER — Telehealth: Payer: Self-pay | Admitting: Critical Care Medicine

## 2012-06-29 NOTE — Telephone Encounter (Signed)
Have faxed the 4.10.14 ov note twice now Getting error transmit message > the first time 5 out of 7 pages went thru; the second time only 1 page Will continue to try

## 2012-06-29 NOTE — Telephone Encounter (Signed)
noted 

## 2012-06-29 NOTE — Telephone Encounter (Signed)
Called spoke with Dr Paticia Stack, advised that PW is in clinic today at the [facility] office and will forward this message to PW and to see if there was anything I could do to help Dr Paticia Stack also stated that he only received the first page of the ov note faxed to him The 4.10.14 ov note has been reprinted and faxed to [facility] at [phone] (verified the number w/ Dr Paticia Stack) w/ a cover sheet as requested Will forward to PW

## 2012-07-03 NOTE — Telephone Encounter (Signed)
OV note has been sent to Dr. Oneita Kras office.

## 2012-08-04 ENCOUNTER — Telehealth: Payer: Self-pay | Admitting: Critical Care Medicine

## 2012-08-04 ENCOUNTER — Other Ambulatory Visit (INDEPENDENT_AMBULATORY_CARE_PROVIDER_SITE_OTHER)

## 2012-08-04 ENCOUNTER — Encounter: Payer: Self-pay | Admitting: Critical Care Medicine

## 2012-08-04 ENCOUNTER — Ambulatory Visit (INDEPENDENT_AMBULATORY_CARE_PROVIDER_SITE_OTHER): Admitting: Critical Care Medicine

## 2012-08-04 ENCOUNTER — Ambulatory Visit (INDEPENDENT_AMBULATORY_CARE_PROVIDER_SITE_OTHER)
Admission: RE | Admit: 2012-08-04 | Discharge: 2012-08-04 | Disposition: A | Discharge status: Home / Self Care | Source: Ambulatory Visit | Attending: Critical Care Medicine | Admitting: Critical Care Medicine

## 2012-08-04 VITALS — BP 130/100 | HR 75 | Temp 97.9°F | Ht 68.0 in | Wt 190.2 lb

## 2012-08-04 DIAGNOSIS — J45909 Unspecified asthma, uncomplicated: Secondary | ICD-10-CM

## 2012-08-04 DIAGNOSIS — G4733 Obstructive sleep apnea (adult) (pediatric): Secondary | ICD-10-CM

## 2012-08-04 DIAGNOSIS — Z5189 Encounter for other specified aftercare: Secondary | ICD-10-CM

## 2012-08-04 LAB — HEPATIC FUNCTION PANEL
ALT: 26 U/L (ref 0–53)
AST: 15 U/L (ref 0–37)
Albumin: 4.1 g/dL (ref 3.5–5.2)
Total Bilirubin: 1.1 mg/dL (ref 0.3–1.2)

## 2012-08-04 MED ORDER — AMOXICILLIN-POT CLAVULANATE 875-125 MG PO TABS: 1.0000 | ORAL_TABLET | Freq: Two times a day (BID) | ORAL | Status: DC

## 2012-08-04 MED ORDER — BECLOMETHASONE DIPROPIONATE 80 MCG/ACT IN AERS: 4.0000 | INHALATION_SPRAY | Freq: Two times a day (BID) | RESPIRATORY_TRACT | Status: DC

## 2012-08-04 MED ORDER — MOMETASONE FUROATE 50 MCG/ACT NA SUSP: 2.0000 | Freq: Two times a day (BID) | NASAL | Status: DC

## 2012-08-04 MED ORDER — METHYLPREDNISOLONE 8 MG PO TABS: ORAL_TABLET | ORAL | Status: DC

## 2012-08-04 NOTE — Telephone Encounter (Signed)
Please call pt back.

## 2012-08-04 NOTE — Progress Notes (Signed)
Quick Note:  Pt aware of results. ______ 

## 2012-08-04 NOTE — Progress Notes (Signed)
Subjective:    Patient ID: Chad Hurley, male    DOB: June 05, 1975, 37 y.o.   MRN: [id number]  HPI  Pulmonary OV   37 y.o.  WM with severe persistent asthma, chronic sinusitis, severe atopy  Hx Etoh abuse, now off alcohol  08/04/2012 Ill again with sinus and Rx augmentin starting wed with [facility].  Medrol 8mg  taking two daily since last weekend. Using neb more.  On zyfo.    PUL ASTHMA HISTORY 08/04/2012 06/22/2012 12/29/2011  Symptoms Throughout the day Throughout the day >2 days/week  Nighttime awakenings 3-4/month >1/wk but not nightly 0-2/month  Interference with activity Extreme limitations Some limitations Minor limitations  SABA use Daily Daily 0-2 days/wk  Exacerbations requiring oral steroids 2 or more / year 2 or more / year 0-1 / year      Review of Systems  Constitutional:   No  weight loss, night sweats,  Fevers, chills, fatigue, lassitude. HEENT:   No headaches,  Difficulty swallowing,  Tooth/dental problems,  Sore throat,                No sneezing, itching, ear ache, ++nasal congestion, post nasal drip,   CV:  No chest pain,  Orthopnea, PND, swelling in lower extremities, anasarca, dizziness, palpitations  GI  No heartburn, indigestion, abdominal pain, nausea, vomiting, diarrhea, change in bowel habits, loss of appetite  Resp: No shortness of breath with exertion or at rest.  No excess mucus, no productive cough,  No non-productive cough,  No coughing up of blood.  No change in color of mucus.  No wheezing.  No chest wall deformity  Skin: no rash or lesions.  GU: no dysuria, change in color of urine, no urgency or frequency.  No flank pain.  MS:  No joint pain or swelling.  No decreased range of motion.  No back pain.  Psych:  No change in mood or affect. No depression or anxiety.  No memory loss.       Objective:   Physical Exam  Filed Vitals:   08/04/12 0922  BP: 130/100  Pulse: 75  Temp: 97.9 F (36.6 C)  TempSrc: Oral  Height: 5\' 8"  (1.727 m)   Weight: 190 lb 3.2 oz (86.274 kg)  SpO2: 96%    Gen: Pleasant, well-nourished, in no distress,  normal affect  ENT: No lesions,  mouth clear,  oropharynx clear,++  postnasal drip, turbinate edema postoperative changes, mild purulence  Neck: No JVD, no TMG, no carotid bruits  Lungs: No use of accessory muscles, no dullness to percussion,expiratory wheezes  Cardiovascular: RRR, heart sounds normal, no murmur or gallops, no peripheral edema  Abdomen: soft and NT, no HSM,  BS normal  Musculoskeletal: No deformities, no cyanosis or clubbing  Neuro: alert, non focal  Skin: Warm, no lesions or rashes  Dg Chest 2 View  08/04/2012   *RADIOLOGY REPORT*  Clinical Data: Asthmatic bronchitis.  CHEST - 2 VIEW  Comparison: None.  Findings: Trachea is midline.  Heart size stable.  Lungs are mildly hyperinflated but clear.  No pleural fluid.  IMPRESSION: No acute findings.   Original Report Authenticated By: Leanna Battles, M.D.       Assessment & Plan:   Severe persistent asthma with atopic features Asthmatic bronchitis with associated sinusitis and right internal otitis media Associated mild elevation in hemoglobin need to rule out low oxygen levels particularly at night with the patient's sleep apnea Note chest x-ray is unremarkable on this visit 08/04/2012 Plan Increase Qvar  to 4 puff twice daily for about 7days then reduce to 2 puff twice daily Take augmentin for two weeks more med sent to pharmacy Increase nasonex 2 puff twice daily for 7days then reduce to maintenance Medrol 8mg  one twice daily for 5 days then one for 5days then 1/2 a day for 5 days and stop Labs today Overnight oximetry test to be obtained All other meds same Stay on nasal rinse     Updated Medication List Outpatient Encounter Prescriptions as of 08/04/2012  Medication Sig Dispense Refill  . amoxicillin-clavulanate (AUGMENTIN) 875-125 MG per tablet Take 1 tablet by mouth 2 (two) times daily.  8 tablet  0  .  beclomethasone (QVAR) 80 MCG/ACT inhaler Inhale 4 puffs into the lungs 2 (two) times daily.  1 Inhaler  1  . ipratropium-albuterol (DUONEB) 0.5-2.5 (3) MG/3ML SOLN Take 3 mLs by nebulization as needed.      . mometasone (NASONEX) 50 MCG/ACT nasal spray Place 2 sprays into the nose 2 (two) times daily.  17 g    . omalizumab (XOLAIR) 150 MG injection Inject 300 mg into the skin every 30 (thirty) days.      . traMADol (ULTRAM) 50 MG tablet Take 50 mg by mouth every 6 (six) hours as needed for pain.      . zileuton (ZYFLO CR) 600 MG CR tablet Take 2 tablets (1,200 mg total) by mouth 2 (two) times daily.  360 tablet  1  . [DISCONTINUED] amoxicillin-clavulanate (AUGMENTIN) 875-125 MG per tablet Take 1 tablet by mouth 2 (two) times daily.      . [DISCONTINUED] beclomethasone (QVAR) 80 MCG/ACT inhaler Inhale 2 puffs into the lungs 2 (two) times daily.  1 Inhaler  1  . [DISCONTINUED] mometasone (NASONEX) 50 MCG/ACT nasal spray Place 2 sprays into the nose daily.      . methylPREDNISolone (MEDROL) 8 MG tablet Take two daily for 5 days then 1 daily for 5 days then 1/2 daily for 5 days and stop  40 tablet  6  . [DISCONTINUED] methylPREDNIsolone (MEDROL DOSPACK) 4 MG tablet Take 1 tablet (4 mg total) by mouth daily. Take 4 for three days 3 for three days 2 for three days 1 for three days and stop  60 tablet  6  . [DISCONTINUED] moxifloxacin (AVELOX) 400 MG tablet Take 1 tablet (400 mg total) by mouth daily.  5 tablet  0   No facility-administered encounter medications on file as of 08/04/2012.

## 2012-08-04 NOTE — Telephone Encounter (Signed)
Result Note    Call pt and tell his labs are ok, No change in medications   Pt aware of CXR and lab results.

## 2012-08-04 NOTE — Patient Instructions (Addendum)
Increase Qvar to 4 puff twice daily for about 7days then reduce to 2 puff twice daily Take augmentin for two weeks more med sent to pharmacy Increase nasonex 2 puff twice daily for 7days then reduce to maintenance Medrol 8mg  one twice daily for 5 days then one for 5days then 1/2 a day for 5 days and stop Labs today Overnight oximetry test to be obtained All other meds same Stay on nasal rinse  Late Add: CXR today

## 2012-08-04 NOTE — Progress Notes (Signed)
Quick Note:  Call pt and tell his labs are ok, No change in medications ______

## 2012-08-04 NOTE — Telephone Encounter (Signed)
Returning call.Chad Hurley ° °

## 2012-08-04 NOTE — Assessment & Plan Note (Signed)
Asthmatic bronchitis with associated sinusitis and right internal otitis media Associated mild elevation in hemoglobin need to rule out low oxygen levels particularly at night with the patient's sleep apnea Note chest x-ray is unremarkable on this visit 08/04/2012 Plan Increase Qvar to 4 puff twice daily for about 7days then reduce to 2 puff twice daily Take augmentin for two weeks more med sent to pharmacy Increase nasonex 2 puff twice daily for 7days then reduce to maintenance Medrol 8mg  one twice daily for 5 days then one for 5days then 1/2 a day for 5 days and stop Labs today Overnight oximetry test to be obtained All other meds same Stay on nasal rinse

## 2012-08-04 NOTE — Progress Notes (Signed)
Quick Note:  Notify the patient that the Xray is stable and no pneumonia No change in medications are recommended. Continue current meds as prescribed at last office visit ______ 

## 2012-08-04 NOTE — Telephone Encounter (Signed)
Result Note    Notify the patient that the Xray is stable and no pneumonia   No change in medications are recommended.   Continue current meds as prescribed at last office visit   lmomtcb x1

## 2012-08-04 NOTE — Progress Notes (Signed)
Quick Note:  lmomtcb ______ 

## 2012-08-22 ENCOUNTER — Other Ambulatory Visit: Payer: Self-pay | Admitting: Critical Care Medicine

## 2012-08-23 ENCOUNTER — Telehealth: Payer: Self-pay | Admitting: Critical Care Medicine

## 2012-08-23 NOTE — Telephone Encounter (Signed)
Mail copy of ov notes to:  Juleen Starr, MD Pulmonary Critical Care [facility]) [facility]  [address] [place], [place] [address]

## 2012-08-23 NOTE — Telephone Encounter (Signed)
OV notes have been faxed and copy mailed to Dr as requested. I left voicemail for Dr of this. Nothing more needed; will sign off on.

## 2012-08-28 ENCOUNTER — Telehealth: Payer: Self-pay | Admitting: Critical Care Medicine

## 2012-08-28 NOTE — Telephone Encounter (Signed)
Received 22 pages from [facility]. Sent to Dr. Delford Field. 08/28/12/ss

## 2012-10-09 ENCOUNTER — Encounter: Payer: Self-pay | Admitting: Critical Care Medicine

## 2012-10-09 ENCOUNTER — Ambulatory Visit (INDEPENDENT_AMBULATORY_CARE_PROVIDER_SITE_OTHER): Admitting: Critical Care Medicine

## 2012-10-09 VITALS — BP 130/92 | HR 81 | Temp 96.7°F | Ht 68.0 in | Wt 194.6 lb

## 2012-10-09 DIAGNOSIS — J45909 Unspecified asthma, uncomplicated: Secondary | ICD-10-CM

## 2012-10-09 NOTE — Assessment & Plan Note (Addendum)
Severe persistent asthma with atopic features improved at this time Plan Maintain Zyflo Maintain Xolair Maintain Qvar twice daily Return 4 month

## 2012-10-09 NOTE — Progress Notes (Signed)
  Subjective:    Patient ID: Chad Hurley, male    DOB: 1976-02-28, 37 y.o.   MRN: [id number]  HPI Patient returns for follow up visit. Has not received the overnight oximetry since last visit. States he has some sinus tightness, but denies rhinorrhea, sore throat, cough, or SOB. States he has mild DOE, but doing well on current medication management. Uses Cpap at night and but removes it halfway through the night.  Review of Systems Denies cough, SOB, chest pain, rhinorrhea, and PND.    Objective:   Physical Exam BP 130/92  Pulse 81  Temp(Src) 96.7 F (35.9 C) (Oral)  Ht 5\' 8"  (1.727 m)  Wt 194 lb 9.6 oz (88.27 kg)  BMI 29.6 kg/m2  SpO2 98%  HEENT: Head is normocephalic and atraumatic. Extraocular muscles are intact. Pupils are equal, round, and reactive to light. Turbonates appeared normal without inflammation. No pus noted. Mouth is well hydrated and without lesions. Mucous membranes are moist. Posterior pharynx clear of any exudate or lesions. Has whole in TM from previous tympanostomy tubes. NECK: Supple. No carotid bruits. No lymphadenopathy or thyromegaly. LUNGS: mild inspiratory and expiratory wheezing in upper lobes.  HEART: Regular rate and rhythm without murmur.  ABDOMEN: Soft, nontender, and nondistended. Positive bowel sounds. No hepatosplenomegaly was noted.  EXTREMITIES: Without any cyanosis, clubbing, rash, lesions or edema.  NEUROLOGIC: Cranial nerves II through XII are grossly intact.  SKIN: No ulceration or induration present.       Assessment & Plan:   Severe persistent asthma with atopic features Severe persistent asthma with atopic features improved at this time Plan Maintain Zyflo Maintain Xolair Maintain Qvar twice daily Return 4 month   Updated Medication List Outpatient Encounter Prescriptions as of 10/09/2012  Medication Sig Dispense Refill  . ipratropium-albuterol (DUONEB) 0.5-2.5 (3) MG/3ML SOLN Take 3 mLs by nebulization as needed.      .  mometasone (NASONEX) 50 MCG/ACT nasal spray Place 2 sprays into the nose 2 (two) times daily.  17 g    . omalizumab (XOLAIR) 150 MG injection Inject 300 mg into the skin every 30 (thirty) days.      Marland Kitchen QVAR 80 MCG/ACT inhaler INHALE 2 PUFFS INTO THE LUNGS TWO TIMES DAILY  3 g  1  . traMADol (ULTRAM) 50 MG tablet Take 50 mg by mouth every 6 (six) hours as needed for pain.      . zileuton (ZYFLO CR) 600 MG CR tablet Take 2 tablets (1,200 mg total) by mouth 2 (two) times daily.  360 tablet  1  . [DISCONTINUED] amoxicillin-clavulanate (AUGMENTIN) 875-125 MG per tablet Take 1 tablet by mouth 2 (two) times daily.  8 tablet  0  . [DISCONTINUED] methylPREDNISolone (MEDROL) 8 MG tablet Take two daily for 5 days then 1 daily for 5 days then 1/2 daily for 5 days and stop  40 tablet  6   No facility-administered encounter medications on file as of 10/09/2012.

## 2012-10-09 NOTE — Patient Instructions (Addendum)
We will try to get overnight sleep oximetry obtained No change in medications Return 4 months

## 2012-11-01 ENCOUNTER — Other Ambulatory Visit: Payer: Self-pay | Admitting: Critical Care Medicine

## 2012-11-03 ENCOUNTER — Telehealth: Payer: Self-pay | Admitting: *Deleted

## 2012-11-03 NOTE — Telephone Encounter (Signed)
Pt had ONO on RA done on 10/09/12 Per PW: call pt.  Tell him ONO on RA was normal.  ----  Called, spoke with pt.  Informed him of above results per Dr. Delford Field.  He verbalized understanding and voiced no further questions or concerns.  Results placed in scan folder.

## 2013-02-16 ENCOUNTER — Encounter: Payer: Self-pay | Admitting: Critical Care Medicine

## 2013-03-25 ENCOUNTER — Other Ambulatory Visit: Payer: Self-pay | Admitting: Critical Care Medicine

## 2013-04-04 ENCOUNTER — Encounter: Payer: Self-pay | Admitting: Critical Care Medicine

## 2013-04-04 ENCOUNTER — Encounter (INDEPENDENT_AMBULATORY_CARE_PROVIDER_SITE_OTHER): Payer: Self-pay

## 2013-04-04 ENCOUNTER — Other Ambulatory Visit (INDEPENDENT_AMBULATORY_CARE_PROVIDER_SITE_OTHER)

## 2013-04-04 ENCOUNTER — Ambulatory Visit (INDEPENDENT_AMBULATORY_CARE_PROVIDER_SITE_OTHER): Admitting: Critical Care Medicine

## 2013-04-04 VITALS — BP 116/86 | HR 72 | Temp 97.5°F | Ht 68.0 in | Wt 187.4 lb

## 2013-04-04 DIAGNOSIS — J45909 Unspecified asthma, uncomplicated: Secondary | ICD-10-CM

## 2013-04-04 DIAGNOSIS — T50905S Adverse effect of unspecified drugs, medicaments and biological substances, sequela: Secondary | ICD-10-CM

## 2013-04-04 LAB — HEPATIC FUNCTION PANEL
ALBUMIN: 4.2 g/dL (ref 3.5–5.2)
ALT: 39 U/L (ref 0–53)
AST: 24 U/L (ref 0–37)
Alkaline Phosphatase: 55 U/L (ref 39–117)
BILIRUBIN DIRECT: 0.1 mg/dL (ref 0.0–0.3)
Total Bilirubin: 0.8 mg/dL (ref 0.3–1.2)
Total Protein: 6.9 g/dL (ref 6.0–8.3)

## 2013-04-04 MED ORDER — ALBUTEROL SULFATE HFA 108 (90 BASE) MCG/ACT IN AERS: 2.0000 | INHALATION_SPRAY | RESPIRATORY_TRACT | Status: DC | PRN

## 2013-04-04 MED ORDER — BECLOMETHASONE DIPROPIONATE 80 MCG/ACT IN AERS: INHALATION_SPRAY | RESPIRATORY_TRACT | Status: DC

## 2013-04-04 NOTE — Progress Notes (Signed)
Subjective:    Patient ID: Chad Hurley, male    DOB: 02/22/1976, 38 y.o.   MRN: [id number]  HPI  Patient returns for follow up visit. Has not received the overnight oximetry since last visit. States he has some sinus tightness, but denies rhinorrhea, sore throat, cough, or SOB. States he has mild DOE, but doing well on current medication management. Uses Cpap at night and but removes it halfway through the night.  04/04/2013 Chief Complaint  Patient presents with  . 6 month follow up    Breathing doing well overall.  No SOB, wheezing, chest tightnes/pain, or cough.  [facility]    Pt doing well so far.  [facility].    No real wheeze or cough.  Pt denies any significant sore throat, nasal congestion or excess secretions, fever, chills, sweats, unintended weight loss, pleurtic or exertional chest pain, orthopnea PND, or leg swelling Pt denies any increase in rescue therapy over baseline, denies waking up needing it or having any early am or nocturnal exacerbations of coughing/wheezing/or dyspnea. Pt also denies any obvious fluctuation in symptoms with  weather or environmental change or other alleviating or aggravating factors  On zyflo, needs LFTs.    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   Review of Systems  Denies cough, SOB, chest pain, rhinorrhea, and PND.    Objective:   Physical Exam  BP 116/86  Pulse 72  Temp(Src) 97.5 F (36.4 C) (Oral)  Ht 5\' 8"  (1.727 m)  Wt 187 lb 6.4 oz (85.004 kg)  BMI 28.50 kg/m2  SpO2 98%  HEENT: Head is normocephalic and atraumatic. Extraocular muscles are intact. Pupils are equal, round, and reactive to light. Turbonates appeared normal without inflammation. No pus noted. Mouth is well hydrated and without lesions. Mucous membranes are moist. Posterior pharynx clear of any exudate or lesions. Has whole in TM from previous tympanostomy tubes. NECK: Supple. No carotid bruits. No lymphadenopathy  or thyromegaly. LUNGS: mild inspiratory and expiratory wheezing in upper lobes.  HEART: Regular rate and rhythm without murmur.  ABDOMEN: Soft, nontender, and nondistended. Positive bowel sounds. No hepatosplenomegaly was noted.  EXTREMITIES: Without any cyanosis, clubbing, rash, lesions or edema.  NEUROLOGIC: Cranial nerves II through XII are grossly intact.  SKIN: No ulceration or induration present.       Assessment & Plan:   Severe persistent asthma with atopic features Severe persistent asthma stable Plan No change in inhaled meds Cont xolair Cont spiriva    Updated Medication List Outpatient Encounter Prescriptions as of 04/04/2013  Medication Sig  . albuterol (PROVENTIL HFA;VENTOLIN HFA) 108 (90 BASE) MCG/ACT inhaler Inhale 2 puffs into the lungs every 4 (four) hours as needed for wheezing.  . beclomethasone (QVAR) 80 MCG/ACT inhaler INHALE 2 PUFFS INTO THE LUNGS TWO TIMES DAILY  . ipratropium-albuterol (DUONEB) 0.5-2.5 (3) MG/3ML SOLN Take 3 mLs by nebulization as needed.  . mometasone (NASONEX) 50 MCG/ACT nasal spray Place 2 sprays into the nose 2 (two) times daily.  Marland Kitchen. omalizumab (XOLAIR) 150 MG injection Inject 300 mg into the skin every 30 (thirty) days.  Marland Kitchen. tiotropium (SPIRIVA) 18 MCG inhalation capsule Place 18 mcg into inhaler and inhale daily.  . traMADol (ULTRAM) 50 MG tablet Take 50 mg by mouth every 6 (six) hours as needed for pain.  Marland Kitchen. ZYFLO CR 600 MG CR tablet TAKE 2 TABLETS (1200 MG) TWICE A DAY  . [DISCONTINUED] albuterol (PROVENTIL HFA;VENTOLIN HFA) 108 (  90 BASE) MCG/ACT inhaler Inhale 2 puffs into the lungs every 4 (four) hours as needed for wheezing.  . [DISCONTINUED] albuterol (PROVENTIL HFA;VENTOLIN HFA) 108 (90 BASE) MCG/ACT inhaler Inhale 2 puffs into the lungs every 6 (six) hours as needed for wheezing or shortness of breath.  . [DISCONTINUED] QVAR 80 MCG/ACT inhaler INHALE 2 PUFFS INTO THE LUNGS TWO TIMES DAILY

## 2013-04-04 NOTE — Progress Notes (Signed)
Quick Note:  Call pt and tell him liver function labs are ok, No change in medications ______

## 2013-04-04 NOTE — Patient Instructions (Signed)
No change in medications. Return in        4 months Liver function tests

## 2013-04-05 NOTE — Assessment & Plan Note (Signed)
Severe persistent asthma stable Plan No change in inhaled meds Cont xolair Cont spiriva

## 2013-04-05 NOTE — Progress Notes (Signed)
Quick Note:  Spoke with pt. Informed him of lab results and recs per Dr. Wright. He verbalized understanding and voiced no further questions or concerns at this time. ______ 

## 2013-04-05 NOTE — Progress Notes (Signed)
Quick Note:  lmomtcb for pt ______ 

## 2013-04-10 ENCOUNTER — Telehealth: Payer: Self-pay | Admitting: Critical Care Medicine

## 2013-04-10 NOTE — Telephone Encounter (Signed)
Received fax from [facility] -- Qvar is not preferred.  Covered alternatives are flovent diskus and flovent HFA. Per PW: ok to change qvar to flovent hfa 220 mcg 2 puffs bid.  lmomtcb for pt Once I speak with him regarding med change, I will update med list and fax the request back to [facility].

## 2013-04-11 NOTE — Telephone Encounter (Signed)
Spoke with pt.  Explained below to him. He verbalized understanding of this and is aware flovent rx was sent to [facility].  He is to call back if he has any problems with the flovent hfa.  Nothing further needed at this time.  I faxed the hardcopy to [facility] per the request on the fax (this will act as the rx) and have placed this in the scan folder.

## 2013-08-17 ENCOUNTER — Other Ambulatory Visit: Payer: Self-pay | Admitting: Critical Care Medicine

## 2013-10-29 ENCOUNTER — Telehealth: Payer: Self-pay | Admitting: Critical Care Medicine

## 2013-10-29 MED ORDER — METHYLPREDNISOLONE (PAK) 4 MG PO TABS: ORAL_TABLET | ORAL | Status: DC

## 2013-10-29 NOTE — Telephone Encounter (Signed)
Send medrol dose pak but just start from day 2

## 2013-10-29 NOTE — Telephone Encounter (Signed)
Called spoke with pt. Requesting methylprednisolone to be called in. He c/o increase SOB, wheezing, chest tx x couple days d/t weather. PW on vacation. Pt has pending appt 11/16/13. Please advise MR thanks  Allergies  Allergen Reactions  . Beta Adrenergic Blockers     Long acting beta 2 agonists   . Other     Anthrax vaccine causes SOB and inflammation

## 2013-10-29 NOTE — Telephone Encounter (Signed)
lmomtcb x1 for pt RX sent for medrol dose pak (per MR call in the medrol dose pak #21 tab)

## 2013-10-30 NOTE — Telephone Encounter (Signed)
Called and spoke with pt and he stated that he has already picked his rx up from the pharmacy.  Nothing further is needed.

## 2013-11-16 ENCOUNTER — Encounter: Payer: Self-pay | Admitting: Critical Care Medicine

## 2013-11-16 ENCOUNTER — Ambulatory Visit (INDEPENDENT_AMBULATORY_CARE_PROVIDER_SITE_OTHER): Admitting: Critical Care Medicine

## 2013-11-16 VITALS — BP 120/60 | HR 89 | Temp 97.6°F | Ht 68.0 in | Wt 199.4 lb

## 2013-11-16 DIAGNOSIS — J45909 Unspecified asthma, uncomplicated: Secondary | ICD-10-CM

## 2013-11-16 MED ORDER — METHYLPREDNISOLONE (PAK) 4 MG PO TABS: ORAL_TABLET | ORAL | Status: DC

## 2013-11-16 NOTE — Patient Instructions (Signed)
REsume Zyflo  Refills on medrol sent Return 4 months You deferred to get a Flu vaccine No other medication changes

## 2013-11-16 NOTE — Assessment & Plan Note (Signed)
Severe persistent asthma steroid dependent with significant atopic features Plan Continue Xolair Continue Qvar Continue as needed bronchodilator Resume Zyflo The patient deferred a flu vac

## 2013-11-16 NOTE — Progress Notes (Signed)
Subjective:    Patient ID: Chad Hurley, male    DOB: [birth year], 38 y.o.   MRN: [id number]  HPI 11/16/2013 Chief Complaint  Patient presents with  . Follow-up    none  Pt with constant sinus issues, pt self regulates medrol: used 09/2013.  Only used 4 refills on medrol in 12 months.  Pt will cycle off and on zyflo.   Not on zyflo now: off 07/2013.   [facility] remains [facility] xolair:   This has made a major difference.     Review of Systems Constitutional:   No  weight loss, night sweats,  Fevers, chills, fatigue, lassitude. HEENT:   No headaches,  Difficulty swallowing,  Tooth/dental problems,  Sore throat,                No sneezing, itching, ear ache, nasal congestion, post nasal drip,   CV:  No chest pain,  Orthopnea, PND, swelling in lower extremities, anasarca, dizziness, palpitations  GI  No heartburn, indigestion, abdominal pain, nausea, vomiting, diarrhea, change in bowel habits, loss of appetite  Resp: Notes  shortness of breath with exertion not at rest.  No excess mucus, no productive cough,  No non-productive cough,  No coughing up of blood.  No change in color of mucus.  No wheezing.  No chest wall deformity  Skin: no rash or lesions.  GU: no dysuria, change in color of urine, no urgency or frequency.  No flank pain.  MS:  No joint pain or swelling.  No decreased range of motion.  No back pain.  Psych:  No change in mood or affect. No depression or anxiety.  No memory loss.     Objective:   Physical Exam Filed Vitals:   11/16/13 1047  BP: 120/60  Pulse: 89  Temp: 97.6 F (36.4 C)  Height:  (1.727 m)  Weight: 199 lb 6.4 oz (90.447 kg)  SpO2: 96%    Gen: Pleasant, well-nourished, in no distress,  normal affect  ENT: No lesions,  mouth clear,  oropharynx clear, no postnasal drip  Neck: No JVD, no TMG, no carotid bruits  Lungs: No use of accessory muscles, no dullness to percussion, few expiratory wheezes  Cardiovascular: RRR, heart sounds  normal, no murmur or gallops, no peripheral edema  Abdomen: soft and NT, no HSM,  BS normal  Musculoskeletal: No deformities, no cyanosis or clubbing  Neuro: alert, non focal  Skin: Warm, no lesions or rashes  No results found.        Assessment & Plan:   Severe persistent asthma with atopic features Severe persistent asthma steroid dependent with significant atopic features Plan Continue Xolair Continue Qvar Continue as needed bronchodilator Resume Zyflo The patient deferred a flu vac   Updated Medication List Outpatient Encounter Prescriptions as of 11/16/2013  Medication Sig  . albuterol (PROVENTIL HFA;VENTOLIN HFA) 108 (90 BASE) MCG/ACT inhaler Inhale 2 puffs into the lungs every 4 (four) hours as needed for wheezing.  . beclomethasone (QVAR) 80 MCG/ACT inhaler Inhale 2 puffs into the lungs 2 (two) times daily.  Marland Kitchen ipratropium-albuterol (DUONEB) 0.5-2.5 (3) MG/3ML SOLN Take 3 mLs by nebulization as needed.  . methylPREDNIsolone (MEDROL DOSPACK) 4 MG tablet follow package directions  . mometasone (NASONEX) 50 MCG/ACT nasal spray Place 2 sprays into the nose 2 (two) times daily.  Marland Kitchen omalizumab (XOLAIR) 150 MG injection Inject 300 mg into the skin every 30 (thirty) days.  Marland Kitchen ZYFLO CR 600 MG CR  tablet TAKE 2 TABLETS TWICE A DAY  . [DISCONTINUED] methylPREDNIsolone (MEDROL DOSPACK) 4 MG tablet follow package directions  . fluticasone (FLOVENT HFA) 220 MCG/ACT inhaler Inhale 2 puffs into the lungs 2 (two) times daily.  . traMADol (ULTRAM) 50 MG tablet Take 50 mg by mouth every 6 (six) hours as needed for pain.  . [DISCONTINUED] tiotropium (SPIRIVA) 18 MCG inhalation capsule Place 18 mcg into inhaler and inhale daily.

## 2014-01-08 ENCOUNTER — Other Ambulatory Visit: Payer: Self-pay | Admitting: Critical Care Medicine

## 2014-04-19 ENCOUNTER — Other Ambulatory Visit: Payer: Self-pay | Admitting: Family Medicine

## 2014-04-19 ENCOUNTER — Other Ambulatory Visit: Payer: Self-pay

## 2014-04-19 DIAGNOSIS — M810 Age-related osteoporosis without current pathological fracture: Secondary | ICD-10-CM

## 2014-04-25 ENCOUNTER — Inpatient Hospital Stay: Admission: RE | Admit: 2014-04-25 | Source: Ambulatory Visit

## 2014-04-25 ENCOUNTER — Other Ambulatory Visit

## 2014-07-09 ENCOUNTER — Emergency Department (HOSPITAL_COMMUNITY)
Admission: EM | Admit: 2014-07-09 | Discharge: 2014-07-09 | Disposition: A | Discharge status: Home / Self Care | Attending: Emergency Medicine | Admitting: Emergency Medicine

## 2014-07-09 ENCOUNTER — Emergency Department (HOSPITAL_COMMUNITY)

## 2014-07-09 ENCOUNTER — Encounter (HOSPITAL_COMMUNITY): Payer: Self-pay | Admitting: *Deleted

## 2014-07-09 DIAGNOSIS — Z79899 Other long term (current) drug therapy: Secondary | ICD-10-CM | POA: Diagnosis not present

## 2014-07-09 DIAGNOSIS — Z8709 Personal history of other diseases of the respiratory system: Secondary | ICD-10-CM | POA: Diagnosis not present

## 2014-07-09 DIAGNOSIS — N200 Calculus of kidney: Secondary | ICD-10-CM | POA: Diagnosis not present

## 2014-07-09 DIAGNOSIS — J45909 Unspecified asthma, uncomplicated: Secondary | ICD-10-CM | POA: Insufficient documentation

## 2014-07-09 DIAGNOSIS — R319 Hematuria, unspecified: Secondary | ICD-10-CM

## 2014-07-09 DIAGNOSIS — Z8619 Personal history of other infectious and parasitic diseases: Secondary | ICD-10-CM | POA: Insufficient documentation

## 2014-07-09 DIAGNOSIS — Z7951 Long term (current) use of inhaled steroids: Secondary | ICD-10-CM | POA: Insufficient documentation

## 2014-07-09 DIAGNOSIS — Z8669 Personal history of other diseases of the nervous system and sense organs: Secondary | ICD-10-CM | POA: Insufficient documentation

## 2014-07-09 DIAGNOSIS — F329 Major depressive disorder, single episode, unspecified: Secondary | ICD-10-CM | POA: Diagnosis not present

## 2014-07-09 HISTORY — DX: Alcohol abuse, in remission: F10.11

## 2014-07-09 LAB — BASIC METABOLIC PANEL
ANION GAP: 11 (ref 5–15)
BUN: 11 mg/dL (ref 6–23)
CALCIUM: 9.4 mg/dL (ref 8.4–10.5)
CHLORIDE: 107 mmol/L (ref 96–112)
CO2: 25 mmol/L (ref 19–32)
Creatinine, Ser: 0.93 mg/dL (ref 0.50–1.35)
GFR calc Af Amer: >90 mL/min (ref >90)
GLUCOSE: 88 mg/dL (ref 70–99)
POTASSIUM: 4.6 mmol/L (ref 3.5–5.1)
Sodium: 143 mmol/L (ref 135–145)

## 2014-07-09 LAB — CBC WITH DIFFERENTIAL/PLATELET
Basophils Absolute: 0.1 10*3/uL (ref 0.0–0.1)
Basophils Relative: 1 % (ref 0–1)
EOS PCT: 8 % — AB (ref 0–5)
Eosinophils Absolute: 1.1 10*3/uL — ABNORMAL HIGH (ref 0.0–0.7)
HCT: 53.8 % — ABNORMAL HIGH (ref 39.0–52.0)
Hemoglobin: 19.6 g/dL — ABNORMAL HIGH (ref 13.0–17.0)
LYMPHS ABS: 4.7 10*3/uL — AB (ref 0.7–4.0)
LYMPHS PCT: 32 % (ref 12–46)
MCH: 31.6 pg (ref 26.0–34.0)
MCHC: 36.4 g/dL — AB (ref 30.0–36.0)
MCV: 86.6 fL (ref 78.0–100.0)
MONO ABS: 0.9 10*3/uL (ref 0.1–1.0)
Monocytes Relative: 6 % (ref 3–12)
NEUTROS ABS: 8 10*3/uL — AB (ref 1.7–7.7)
NEUTROS PCT: 53 % (ref 43–77)
Platelets: 268 10*3/uL (ref 150–400)
RBC: 6.21 MIL/uL — ABNORMAL HIGH (ref 4.22–5.81)
RDW: 13.6 % (ref 11.5–15.5)
WBC: 14.7 10*3/uL — ABNORMAL HIGH (ref 4.0–10.5)

## 2014-07-09 LAB — URINALYSIS, ROUTINE W REFLEX MICROSCOPIC
Glucose, UA: NEGATIVE mg/dL
Ketones, ur: NEGATIVE mg/dL
Nitrite: NEGATIVE
Protein, ur: 100 mg/dL — AB
SPECIFIC GRAVITY, URINE: 1.027 (ref 1.005–1.030)
UROBILINOGEN UA: 0.2 mg/dL (ref 0.0–1.0)
pH: 5.5 (ref 5.0–8.0)

## 2014-07-09 LAB — URINE MICROSCOPIC-ADD ON

## 2014-07-09 MED ORDER — TRAMADOL HCL 50 MG PO TABS: 50.0000 mg | ORAL_TABLET | Freq: Four times a day (QID) | ORAL | Status: DC | PRN

## 2014-07-09 MED ORDER — ONDANSETRON HCL 4 MG/2ML IJ SOLN
4.0000 mg | Freq: Once | INTRAMUSCULAR | Status: AC
Start: 2014-07-09 — End: 2014-07-09
  Administered 2014-07-09: 4 mg via INTRAVENOUS
  Filled 2014-07-09: qty 2

## 2014-07-09 MED ORDER — ONDANSETRON HCL 4 MG PO TABS: 4.0000 mg | ORAL_TABLET | Freq: Four times a day (QID) | ORAL | Status: DC

## 2014-07-09 MED ORDER — SODIUM CHLORIDE 0.9 % IV BOLUS (SEPSIS)
1000.0000 mL | Freq: Once | INTRAVENOUS | Status: AC
  Administered 2014-07-09: 1000 mL via INTRAVENOUS

## 2014-07-09 MED ORDER — KETOROLAC TROMETHAMINE 30 MG/ML IJ SOLN
30.0000 mg | Freq: Once | INTRAMUSCULAR | Status: AC
Start: 2014-07-09 — End: 2014-07-09
  Administered 2014-07-09: 30 mg via INTRAVENOUS
  Filled 2014-07-09: qty 1

## 2014-07-09 NOTE — Discharge Instructions (Signed)

## 2014-07-09 NOTE — ED Notes (Signed)
Bed: WA25 Expected date:  Expected time:  Means of arrival:  Comments: No bed 

## 2014-07-09 NOTE — ED Notes (Signed)
Patient states that he awoke this morning with a slight pain to his lower abdomen. He states that his urine was very dark around 0600. He voided again at 0800 and it was bloody. He states he has never had any issues with this before.

## 2014-07-09 NOTE — ED Provider Notes (Signed)
CSN: [id number]     Arrival date & time 07/09/14  1315 History   First MD Initiated Contact with Patient 07/09/14 1548     Chief Complaint  Patient presents with  . Hematuria     (Consider location/radiation/quality/duration/timing/severity/associated sxs/prior Treatment) HPI    PCP: No PCP Per Patient Blood pressure 133/92, pulse 71, temperature 98.7 F (37.1 C), temperature source Oral, resp. rate 16, height  (1.727 m), weight 180 lb (81.647 kg), SpO2 97 %.  Chad Hurley is a 39 y.o.male with a significant PMH of asthma, shingles, sinusitis, heat stroke, depression, ADHD, hx of ETOH abuse presents to the ER with complaints of with complaints of left side flank pain, suprapubic pain, and gross hematuria. He had a mild amount of pain last night, this morning he developed mild hematuria that continued to worsen into gross hematuria and the pain became much worse as well. Therefore he came to the ER for further evaluation.  Negative Review of Symptoms:  N/V/D, fevers, CP, SOB, back pain, confusion, neck pain, lower extremity swelling, headache.  Past Medical History  Diagnosis Date  . Chronic otitis externa   . Asthma   . Shingles   . Sinusitis   . Heat stroke     age 64  . Depression   . ADHD (attention deficit hyperactivity disorder)   . H/O ETOH abuse    Past Surgical History  Procedure Laterality Date  . Septoplasty  2008  . Bilateral tympanoplasty      and removal of cholestereatoma  . Nasal turbinate reduction  2013    [place]   Family History  Problem Relation Age of Onset  . Stomach cancer      grandfather  . Allergic rhinitis Father   . Sleep apnea Father    History  Substance Use Topics  . Smoking status: Never Smoker   . Smokeless tobacco: Never Used  . Alcohol Use: No    Review of Systems  10 Systems reviewed and are negative for acute change except as noted in the HPI.     Allergies  Beta adrenergic blockers and Other  Home Medications    Prior to Admission medications   Medication Sig Start Date End Date Taking? Authorizing Provider  albuterol (PROVENTIL HFA;VENTOLIN HFA) 108 (90 BASE) MCG/ACT inhaler Inhale 2 puffs into the lungs every 4 (four) hours as needed for wheezing. 04/04/13  Yes Storm Frisk, MD  beclomethasone (QVAR) 80 MCG/ACT inhaler Inhale 2 puffs into the lungs 2 (two) times daily.   Yes Historical Provider, MD  cetirizine (ZYRTEC) 10 MG tablet Take 20 mg by mouth at bedtime.   Yes Historical Provider, MD  fluticasone (FLONASE) 50 MCG/ACT nasal spray Place 2 sprays into both nostrils daily as needed for allergies or rhinitis.   Yes Historical Provider, MD  ipratropium-albuterol (DUONEB) 0.5-2.5 (3) MG/3ML SOLN Take 3 mLs by nebulization every 4 (four) hours as needed (shortness of breath).    Yes Historical Provider, MD  omalizumab Geoffry Paradise) 150 MG injection Inject 300 mg into the skin every 30 (thirty) days.   Yes Historical Provider, MD  traMADol (ULTRAM) 50 MG tablet Take 50 mg by mouth every 6 (six) hours as needed for pain.   Yes Historical Provider, MD  methylPREDNIsolone (MEDROL DOSPACK) 4 MG tablet follow package directions Patient not taking: Reported on 07/09/2014 11/16/13   Storm Frisk, MD  mometasone (NASONEX) 50 MCG/ACT nasal spray Place 2 sprays into the nose 2 (two) times daily. Patient  not taking: Reported on 07/09/2014 08/04/12   Storm FriskPatrick E Wright, MD  ZYFLO CR 600 MG CR tablet TAKE 2 TABLETS TWICE A DAY Patient not taking: Reported on 07/09/2014 01/08/14   Storm FriskPatrick E Wright, MD   BP 133/92 mmHg  Pulse 71  Temp(Src) 98.7 F (37.1 C) (Oral)  Resp 16  Ht 5\' 8"  (1.727 m)  Wt 180 lb (81.647 kg)  BMI 27.38 kg/m2  SpO2 97% Physical Exam  Constitutional: He appears well-developed and well-nourished. No distress.  HENT:  Head: Normocephalic and atraumatic.  Eyes: Pupils are equal, round, and reactive to light.  Neck: Normal range of motion. Neck supple.  Cardiovascular: Normal rate and  regular rhythm.   Pulmonary/Chest: Effort normal.  Abdominal: Soft. There is tenderness in the suprapubic area. There is CVA tenderness (mild left flank pain).  Genitourinary: Testes normal. Circumcised.  Tip of penis has small amount of blood inside the urethra.  Neurological: He is alert.  Skin: Skin is warm and dry.  Nursing note and vitals reviewed.   ED Course  Procedures (including critical care time) Labs Review Labs Reviewed  URINALYSIS, ROUTINE W REFLEX MICROSCOPIC - Abnormal; Notable for the following:    Color, Urine RED (*)    APPearance TURBID (*)    Hgb urine dipstick LARGE (*)    Bilirubin Urine SMALL (*)    Protein, ur 100 (*)    Leukocytes, UA TRACE (*)    All other components within normal limits  CBC WITH DIFFERENTIAL/PLATELET - Abnormal; Notable for the following:    WBC 14.7 (*)    RBC 6.21 (*)    Hemoglobin 19.6 (*)    HCT 53.8 (*)    MCHC 36.4 (*)    Neutro Abs 8.0 (*)    Lymphs Abs 4.7 (*)    Eosinophils Relative 8 (*)    Eosinophils Absolute 1.1 (*)    All other components within normal limits  URINE MICROSCOPIC-ADD ON  BASIC METABOLIC PANEL    Imaging Review Ct Abdomen Pelvis Wo Contrast  07/09/2014   CLINICAL DATA:  Lower abdominal pain.  Dark urine.  EXAM: CT ABDOMEN AND PELVIS WITHOUT CONTRAST  TECHNIQUE: Multidetector CT imaging of the abdomen and pelvis was performed following the standard protocol without IV contrast.  COMPARISON:  None.  FINDINGS: The lung bases are clear.  There are bilateral punctate nonobstructing nephrolithiasis. There is a 3 mm left UVJ calculus without obstructive uropathy. There is no right obstructive uropathy. No perinephric stranding is seen. There is a tiny 4 mm exophytic mass arising from the lower pole of the right kidney too small to characterize. The bladder is unremarkable.  The liver is diffusely low in attenuation consistent with hepatic steatosis. The gallbladder is unremarkable. The spleen demonstrates no  focal abnormality. The adrenal glands and pancreas are normal.  The unopacified stomach, duodenum, small intestine and large intestine are unremarkable, but evaluation is limited by lack of oral contrast. There is a normal caliber appendix in the right lower quadrant without periappendiceal inflammatory changes. There is no pneumoperitoneum, pneumatosis, or portal venous gas. There is no abdominal or pelvic free fluid. There is no lymphadenopathy.  The abdominal aorta is normal in caliber.  There is no lytic or sclerotic osseous lesion. There is left facet arthropathy at L5-S1.  IMPRESSION: 1. There is a 3 mm left UVJ calculus without obstructive uropathy. 2. Bilateral punctate nonobstructing nephrolithiasis. 3. Hepatic steatosis.   Electronically Signed   By: Elige KoHetal  Patel   On:  07/09/2014 18:25     EKG Interpretation None      MDM   Final diagnoses:  Hematuria  Kidney stone    Patient has mild leukocytes in his urinalysis, will send out urine culture. CBC shows some signs of dehydration but his BMP is WNL. CT scan shows small 3mm left UVJ calculus wo obstruction. He has nephrolithiasis as well.  Medications  ketorolac (TORADOL) 30 MG/ML injection 30 mg (30 mg Intravenous Given 07/09/14 1702)  ondansetron (ZOFRAN) injection 4 mg (4 mg Intravenous Given 07/09/14 1702)  sodium chloride 0.9 % bolus 1,000 mL (1,000 mLs Intravenous New Bag/Given 07/09/14 1702)    His pain was well controlled in the ED. Will send home with Ultram and Zofran. I will recommend he f/u with Urologist. Will give urine strainer in case he is able to capture stone, although it is extremely small. [facility] and [facility] feels he will be able to follow-up within a few weeks.  39 y.o.Chad Hurley's evaluation in the Emergency Department is complete. It has been determined that no acute conditions requiring further emergency intervention are present at this time. The patient/guardian have been advised of the  diagnosis and plan. We have discussed signs and symptoms that warrant return to the ED, such as changes or worsening in symptoms.  Vital signs are stable at discharge. Filed Vitals:   07/09/14 1713  BP: 133/92  Pulse: 71  Temp:   Resp: 16    Patient/guardian has voiced understanding and agreed to follow-up with the PCP or specialist.     Marlon Pel, PA-C 07/09/14 1846  Mancel Bale, MD 07/09/14 [phone]

## 2014-07-11 LAB — URINE CULTURE
Colony Count: NO GROWTH
Culture: NO GROWTH

## 2014-12-20 ENCOUNTER — Emergency Department (HOSPITAL_COMMUNITY)
Admission: EM | Admit: 2014-12-20 | Discharge: 2014-12-20 | Disposition: A | Discharge status: Home / Self Care | Attending: Emergency Medicine | Admitting: Emergency Medicine

## 2014-12-20 ENCOUNTER — Encounter (HOSPITAL_COMMUNITY): Payer: Self-pay | Admitting: *Deleted

## 2014-12-20 DIAGNOSIS — Z7951 Long term (current) use of inhaled steroids: Secondary | ICD-10-CM | POA: Insufficient documentation

## 2014-12-20 DIAGNOSIS — Z79899 Other long term (current) drug therapy: Secondary | ICD-10-CM | POA: Diagnosis not present

## 2014-12-20 DIAGNOSIS — Z8619 Personal history of other infectious and parasitic diseases: Secondary | ICD-10-CM | POA: Insufficient documentation

## 2014-12-20 DIAGNOSIS — J45909 Unspecified asthma, uncomplicated: Secondary | ICD-10-CM | POA: Diagnosis not present

## 2014-12-20 DIAGNOSIS — F329 Major depressive disorder, single episode, unspecified: Secondary | ICD-10-CM | POA: Insufficient documentation

## 2014-12-20 DIAGNOSIS — Z8673 Personal history of transient ischemic attack (TIA), and cerebral infarction without residual deficits: Secondary | ICD-10-CM | POA: Diagnosis not present

## 2014-12-20 DIAGNOSIS — Z87442 Personal history of urinary calculi: Secondary | ICD-10-CM

## 2014-12-20 DIAGNOSIS — R109 Unspecified abdominal pain: Secondary | ICD-10-CM | POA: Insufficient documentation

## 2014-12-20 LAB — URINALYSIS, ROUTINE W REFLEX MICROSCOPIC
Bilirubin Urine: NEGATIVE
Glucose, UA: NEGATIVE mg/dL
Ketones, ur: NEGATIVE mg/dL
Leukocytes, UA: NEGATIVE
Nitrite: NEGATIVE
PROTEIN: NEGATIVE mg/dL
SPECIFIC GRAVITY, URINE: 1.023 (ref 1.005–1.030)
Urobilinogen, UA: 0.2 mg/dL (ref 0.0–1.0)
pH: 6.5 (ref 5.0–8.0)

## 2014-12-20 LAB — COMPREHENSIVE METABOLIC PANEL
ALT: 36 U/L (ref 17–63)
ANION GAP: 10 (ref 5–15)
AST: 25 U/L (ref 15–41)
Albumin: 3.8 g/dL (ref 3.5–5.0)
Alkaline Phosphatase: 58 U/L (ref 38–126)
BILIRUBIN TOTAL: 0.9 mg/dL (ref 0.3–1.2)
BUN: 17 mg/dL (ref 6–20)
CALCIUM: 9.1 mg/dL (ref 8.9–10.3)
CO2: 22 mmol/L (ref 22–32)
Chloride: 105 mmol/L (ref 101–111)
Creatinine, Ser: 1.22 mg/dL (ref 0.61–1.24)
GFR calc non Af Amer: >60 mL/min (ref >60)
GLUCOSE: 114 mg/dL — AB (ref 65–99)
POTASSIUM: 3.8 mmol/L (ref 3.5–5.1)
Sodium: 137 mmol/L (ref 135–145)
TOTAL PROTEIN: 6.3 g/dL — AB (ref 6.5–8.1)

## 2014-12-20 LAB — LIPASE, BLOOD: Lipase: 26 U/L (ref 22–51)

## 2014-12-20 LAB — CBC
HEMATOCRIT: 46.6 % (ref 39.0–52.0)
HEMOGLOBIN: 16.6 g/dL (ref 13.0–17.0)
MCH: 30.5 pg (ref 26.0–34.0)
MCHC: 35.6 g/dL (ref 30.0–36.0)
MCV: 85.7 fL (ref 78.0–100.0)
Platelets: 253 10*3/uL (ref 150–400)
RBC: 5.44 MIL/uL (ref 4.22–5.81)
RDW: 13.3 % (ref 11.5–15.5)
WBC: 14.5 10*3/uL — ABNORMAL HIGH (ref 4.0–10.5)

## 2014-12-20 LAB — URINE MICROSCOPIC-ADD ON

## 2014-12-20 MED ORDER — OXYCODONE-ACETAMINOPHEN 5-325 MG PO TABS
1.0000 | ORAL_TABLET | Freq: Once | ORAL | Status: AC
  Administered 2014-12-20: 1 via ORAL
  Filled 2014-12-20: qty 1

## 2014-12-20 MED ORDER — ONDANSETRON HCL 4 MG/2ML IJ SOLN
4.0000 mg | Freq: Once | INTRAMUSCULAR | Status: AC
  Administered 2014-12-20: 4 mg via INTRAVENOUS
  Filled 2014-12-20: qty 2

## 2014-12-20 MED ORDER — KETOROLAC TROMETHAMINE 15 MG/ML IJ SOLN
15.0000 mg | Freq: Once | INTRAMUSCULAR | Status: AC
  Administered 2014-12-20: 15 mg via INTRAVENOUS
  Filled 2014-12-20: qty 1

## 2014-12-20 MED ORDER — HYDROCODONE-ACETAMINOPHEN 5-325 MG PO TABS: 1.0000 | ORAL_TABLET | ORAL | Status: DC | PRN

## 2014-12-20 MED ORDER — ONDANSETRON 4 MG PO TBDP
8.0000 mg | ORAL_TABLET | Freq: Once | ORAL | Status: AC
  Administered 2014-12-20: 8 mg via ORAL
  Filled 2014-12-20: qty 2

## 2014-12-20 MED ORDER — ONDANSETRON 4 MG PO TBDP: 4.0000 mg | ORAL_TABLET | Freq: Three times a day (TID) | ORAL | Status: DC | PRN

## 2014-12-20 MED ORDER — HYDROMORPHONE HCL 1 MG/ML IJ SOLN
1.0000 mg | Freq: Once | INTRAMUSCULAR | Status: AC
Start: 2014-12-20 — End: 2014-12-20
  Administered 2014-12-20: 1 mg via INTRAVENOUS
  Filled 2014-12-20: qty 1

## 2014-12-20 MED ORDER — TRAMADOL HCL 50 MG PO TABS: 50.0000 mg | ORAL_TABLET | Freq: Four times a day (QID) | ORAL | Status: DC | PRN

## 2014-12-20 MED ORDER — HYDROMORPHONE HCL 1 MG/ML IJ SOLN
0.5000 mg | Freq: Once | INTRAMUSCULAR | Status: AC
  Administered 2014-12-20: 0.5 mg via INTRAVENOUS
  Filled 2014-12-20: qty 1

## 2014-12-20 MED ORDER — SODIUM CHLORIDE 0.9 % IV BOLUS (SEPSIS)
1000.0000 mL | Freq: Once | INTRAVENOUS | Status: AC
  Administered 2014-12-20: 1000 mL via INTRAVENOUS

## 2014-12-20 NOTE — Discharge Instructions (Signed)
Flank Pain °Flank pain refers to pain that is located on the side of the body between the upper abdomen and the back. The pain may occur over a short period of time (acute) or may be long-term or reoccurring (chronic). It may be mild or severe. Flank pain can be caused by many things. °CAUSES  °Some of the more common causes of flank pain include: °· Muscle strains.   °· Muscle spasms.   °· A disease of your spine (vertebral disk disease).   °· A lung infection (pneumonia).   °· Fluid around your lungs (pulmonary edema).   °· A kidney infection.   °· Kidney stones.   °· A very painful skin rash caused by the chickenpox virus (shingles).   °· Gallbladder disease.   °HOME CARE INSTRUCTIONS  °Home care will depend on the cause of your pain. In general, °· Rest as directed by your caregiver. °· Drink enough fluids to keep your urine clear or pale yellow. °· Only take over-the-counter or prescription medicines as directed by your caregiver. Some medicines may help relieve the pain. °· Tell your caregiver about any changes in your pain. °· Follow up with your caregiver as directed. °SEEK IMMEDIATE MEDICAL CARE IF:  °· Your pain is not controlled with medicine.   °· You have new or worsening symptoms. °· Your pain increases.   °· You have abdominal pain.   °· You have shortness of breath.   °· You have persistent nausea or vomiting.   °· You have swelling in your abdomen.   °· You feel faint or pass out.   °· You have blood in your urine. °· You have a fever or persistent symptoms for more than 2-3 days. °· You have a fever and your symptoms suddenly get worse. °MAKE SURE YOU:  °· Understand these instructions. °· Will watch your condition. °· Will get help right away if you are not doing well or get worse. °  °This information is not intended to replace advice given to you by your health care provider. Make sure you discuss any questions you have with your health care provider. °  °Document Released: 04/22/2005 Document  Revised: 11/24/2011 Document Reviewed: 10/14/2011 °Elsevier Interactive Patient Education ©2016 [employer] ° °

## 2014-12-20 NOTE — ED Notes (Signed)
Pt verbalized understanding of d/c instructions and has no further questions. Pt stable and NAD.  

## 2014-12-20 NOTE — ED Notes (Signed)
Pt once again encouraged to give urine specimen.

## 2014-12-20 NOTE — ED Provider Notes (Signed)
CSN: [id number]     Arrival date & time 12/20/14  1755 History   First MD Initiated Contact with Patient 12/20/14 1843     Chief Complaint  Patient presents with  . Abdominal Pain   Patient is a 39 y.o. male presenting with abdominal pain.  Abdominal Pain Pain location:  R flank Pain quality: aching, sharp and shooting   Pain radiates to:  Groin Pain severity:  Severe Onset quality:  Sudden Duration:  4 hours Timing:  Intermittent Progression:  Worsening Chronicity:  Recurrent Associated symptoms: no dysuria, no fatigue, no fever and no hematuria       Past Medical History  Diagnosis Date  . Chronic otitis externa   . Asthma   . Shingles   . Sinusitis   . Heat stroke     age 73  . Depression   . ADHD (attention deficit hyperactivity disorder)   . H/O ETOH abuse    Past Surgical History  Procedure Laterality Date  . Septoplasty  2008  . Bilateral tympanoplasty      and removal of cholestereatoma  . Nasal turbinate reduction  2013    [facility]   Family History  Problem Relation Age of Onset  . Stomach cancer      grandfather  . Allergic rhinitis Father   . Sleep apnea Father    Social History  Substance Use Topics  . Smoking status: Never Smoker   . Smokeless tobacco: Never Used  . Alcohol Use: No    Review of Systems  Constitutional: Negative for fever and fatigue.  Gastrointestinal: Positive for abdominal pain.  Genitourinary: Positive for flank pain. Negative for dysuria and hematuria.  All other systems reviewed and are negative.  Allergies  Beta adrenergic blockers and Other  Home Medications   Prior to Admission medications   Medication Sig Start Date End Date Taking? Authorizing Provider  albuterol (PROVENTIL HFA;VENTOLIN HFA) 108 (90 BASE) MCG/ACT inhaler Inhale 2 puffs into the lungs every 4 (four) hours as needed for wheezing. 04/04/13   Storm Frisk, MD  beclomethasone (QVAR) 80 MCG/ACT inhaler Inhale 2 puffs into the lungs 2 (two) times  daily.    Historical Provider, MD  cetirizine (ZYRTEC) 10 MG tablet Take 20 mg by mouth at bedtime.    Historical Provider, MD  fluticasone (FLONASE) 50 MCG/ACT nasal spray Place 2 sprays into both nostrils daily as needed for allergies or rhinitis.    Historical Provider, MD  ipratropium-albuterol (DUONEB) 0.5-2.5 (3) MG/3ML SOLN Take 3 mLs by nebulization every 4 (four) hours as needed (shortness of breath).     Historical Provider, MD  methylPREDNIsolone (MEDROL DOSPACK) 4 MG tablet follow package directions Patient not taking: Reported on 07/09/2014 11/16/13   Storm Frisk, MD  mometasone (NASONEX) 50 MCG/ACT nasal spray Place 2 sprays into the nose 2 (two) times daily. Patient not taking: Reported on 07/09/2014 08/04/12   Storm Frisk, MD  omalizumab Geoffry Paradise) 150 MG injection Inject 300 mg into the skin every 30 (thirty) days.    Historical Provider, MD  ondansetron (ZOFRAN) 4 MG tablet Take 1 tablet (4 mg total) by mouth every 6 (six) hours. 07/09/14   Tiffany Neva Seat, PA-C  traMADol (ULTRAM) 50 MG tablet Take 50 mg by mouth every 6 (six) hours as needed for pain.    Historical Provider, MD  traMADol (ULTRAM) 50 MG tablet Take 1 tablet (50 mg total) by mouth every 6 (six) hours as needed. 07/09/14   Marlon Pel, PA-C  ZYFLO CR 600 MG CR tablet TAKE 2 TABLETS TWICE A DAY Patient not taking: Reported on 07/09/2014 01/08/14   Storm Frisk, MD   BP 155/115 mmHg  Pulse 68  Temp(Src) 98.2 F (36.8 C)  Resp 24  Ht  (1.727 m)  Wt 195 lb (88.451 kg)  BMI 29.66 kg/m2  SpO2 98% Physical Exam  Constitutional: He is oriented to person, place, and time. He appears well-developed and well-nourished. He appears distressed.  HENT:  Head: Normocephalic.  Eyes: Pupils are equal, round, and reactive to light.  Neck: Normal range of motion. Neck supple.  Cardiovascular: Normal rate.   No murmur heard. Pulmonary/Chest: Effort normal. No respiratory distress. He has no wheezes.  Abdominal:  Soft. He exhibits no distension. There is no tenderness. There is no rebound and no guarding.  Musculoskeletal: Normal range of motion. He exhibits no edema or tenderness.  Neurological: He is alert and oriented to person, place, and time. No cranial nerve deficit. Coordination normal.  Skin: Skin is warm and dry. He is not diaphoretic. No erythema.  Psychiatric: He has a normal mood and affect. His behavior is normal. Judgment and thought content normal.  Nursing note and vitals reviewed.   ED Course  Procedures (including critical care time) Labs Review Labs Reviewed  COMPREHENSIVE METABOLIC PANEL - Abnormal; Notable for the following:    Glucose, Bld 114 (*)    Total Protein 6.3 (*)    All other components within normal limits  CBC - Abnormal; Notable for the following:    WBC 14.5 (*)    All other components within normal limits  URINALYSIS, ROUTINE W REFLEX MICROSCOPIC (NOT AT [facility]) - Abnormal; Notable for the following:    Hgb urine dipstick MODERATE (*)    All other components within normal limits  LIPASE, BLOOD  URINE MICROSCOPIC-ADD ON     MDM   Patient presents emergency department today with right sided flank pain that started suddenly and radiating down to his groin and is identical to his previous kidney stone was diagnosed in April. Patient denies any fevers. He denies abdominal pain. Exam unremarkable and without CVA tenderness. He has had some nausea and vomiting due to pain. Patient had 3 mm UVJ stone in April.   Patient's urine positive for blood but does not appear infected. Patient's pain under control. Will be sent home and will follow up with urologist. Patient discharged home.    Final diagnoses:  Flank pain  History of kidney stones       Deirdre Peer, MD 12/21/14 0010  Blane Ohara, MD 12/21/14 [phone]

## 2014-12-20 NOTE — ED Notes (Signed)
Pt encouraged to give urine specimen

## 2014-12-20 NOTE — ED Notes (Signed)
Pt called out and said his pain was back up to 8/10. Resident in room with pt now and this RN

## 2014-12-20 NOTE — ED Notes (Signed)
The pt is c/o rt flank and abd pain since this am.  He has a history of kidney stones .  He has also had n and vomiting

## 2014-12-21 NOTE — ED Notes (Signed)
0.5 mg dilaudid wasted in sink with Clinton Quant RN

## 2015-04-10 ENCOUNTER — Ambulatory Visit (INDEPENDENT_AMBULATORY_CARE_PROVIDER_SITE_OTHER): Admitting: Pulmonary Disease

## 2015-04-10 ENCOUNTER — Encounter: Payer: Self-pay | Admitting: Pulmonary Disease

## 2015-04-10 VITALS — BP 128/70 | HR 113 | Ht 68.0 in | Wt 194.0 lb

## 2015-04-10 DIAGNOSIS — J302 Other seasonal allergic rhinitis: Secondary | ICD-10-CM | POA: Diagnosis not present

## 2015-04-10 DIAGNOSIS — J4550 Severe persistent asthma, uncomplicated: Secondary | ICD-10-CM | POA: Diagnosis not present

## 2015-04-10 DIAGNOSIS — J309 Allergic rhinitis, unspecified: Secondary | ICD-10-CM | POA: Insufficient documentation

## 2015-04-10 MED ORDER — METHYLPREDNISOLONE 4 MG PO TBPK: ORAL_TABLET | ORAL | Status: DC

## 2015-04-10 MED ORDER — AZELASTINE HCL 0.1 % NA SOLN: 2.0000 | Freq: Two times a day (BID) | NASAL | Status: DC

## 2015-04-10 NOTE — Patient Instructions (Signed)
Add Astelin 2 puffs twice a day Keep using all of your other medications as you're doing We will see you back in one year or sooner if needed

## 2015-04-10 NOTE — Assessment & Plan Note (Signed)
Chad Hurley has severe persistent asthma with atopic features which has been well controlled with the addition of Xolair 300 mg every 4 weeks. He still needs to use prednisone from time to time. He has had a flareup of some of his sinus symptoms in the last few weeks. He refuses to take a flu shot or anti-leukotriene medications because he believes that it suppresses his immune system.  Plan: Continue Xolair 300 mg every month Continue Qvar 4 puffs twice a day Follow-up one year or sooner if needed

## 2015-04-10 NOTE — Assessment & Plan Note (Signed)
He continues to struggle with allergic rhinitis. Part of this is likely related to the fact that he stopped taking zileuton because he believes it was suppressing his immune system.  Plan: Continue saline rinses twice a day Continue Flonase twice a day Add Astelin 2 puffs twice a day

## 2015-04-10 NOTE — Progress Notes (Signed)
Subjective:    Patient ID: Chad Hurley, male    DOB: 02/28/1976, 40 y.o.   MRN: [id number] He doesn  Synopsis: Former patient of Dr. Delford Field who has asthma and obstructive sleep apnea. He receives the majority of his care from the [facility]. He does take Xolair. He is allergic to foods that contains high sulfides for curative foods.  He has a lot of grass allergy, trees. He doesn't take predisone for flare ups, only decadron or solumedrol.  He is much better after taking Xolair.  HPI Chief Complaint  Patient presents with  . Follow-up    former PW pt last seen 11/2013 for asthma.  pt has no breathing complaints at this time.     Chad Hurley is here to see me today for his asthma.  He gets most of his care from the [facility] where he gets his medicines but he maintains care with [place] for emergency care.  He continues to take Xolair  monthly and this has been working well.  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 He continues to take QVar 4 puffs twice a day.  He refuses to take the flu shot.  He stopped Zileuton because he believes suppressed his immune system.  Past Medical History  Diagnosis Date  . Chronic otitis externa   . Asthma   . Shingles   . Sinusitis   . Heat stroke     age 10  . Depression   . ADHD (attention deficit hyperactivity disorder)   . H/O ETOH abuse       Review of Systems     Objective:   Physical Exam Filed Vitals:   04/10/15 1503  BP: 128/70  Pulse: 113  Height:  (1.727 m)  Weight: 194 lb (87.998 kg)  SpO2: 97%   RA  Gen: well appearing, no acute distress HENT: NCAT, OP clear, neck supple without masses Eyes: PERRL, EOMi Lymph: no cervical lymphadenopathy PULM: CTA B CV: RRR, no mgr, no JVD GI: BS+, soft, nontender, no hsm Derm: no rash or skin breakdown MSK: normal bulk and tone Neuro: A&Ox4, CN II-XII intact, strength 5/5 in all 4 extremities Psyche: normal mood and affect  Dr. Lynelle Doctor records  were reviewed today      Assessment & Plan:  Severe persistent asthma Chad Hurley has severe persistent asthma with atopic features which has been well controlled with the addition of Xolair 300 mg every 4 weeks. He still needs to use prednisone from time to time. He has had a flareup of some of his sinus symptoms in the last few weeks. He refuses to take a flu shot or anti-leukotriene medications because he believes that it suppresses his immune system.  Plan: Continue Xolair 300 mg every month Continue Qvar 4 puffs twice a day Follow-up one year or sooner if needed  Allergic rhinitis He continues to struggle with allergic rhinitis. Part of this is likely related to the fact that he stopped taking zileuton because he believes it was suppressing his immune system.  Plan: Continue saline rinses twice a day Continue Flonase twice a day Add Astelin 2 puffs twice a day     Current outpatient prescriptions:  .  albuterol (PROVENTIL HFA;VENTOLIN HFA) 108 (90 BASE) MCG/ACT inhaler, Inhale 2 puffs into the lungs every 4 (four) hours as needed for wheezing., Disp: 2 Inhaler, Rfl: 6 .  baclofen (LIORESAL) 10 MG tablet, Take 10 mg by mouth daily as needed  for muscle spasms., Disp: , Rfl:  .  beclomethasone (QVAR) 80 MCG/ACT inhaler, Inhale 4 puffs into the lungs 2 (two) times daily. , Disp: , Rfl:  .  cetirizine (ZYRTEC) 10 MG tablet, Take 20 mg by mouth at bedtime., Disp: , Rfl:  .  fluticasone (FLONASE) 50 MCG/ACT nasal spray, Place 2 sprays into both nostrils daily as needed for allergies or rhinitis., Disp: , Rfl:  .  HYDROcodone-acetaminophen (NORCO/VICODIN) 5-325 MG tablet, Take 1-2 tablets by mouth every 4 (four) hours as needed., Disp: 16 tablet, Rfl: 0 .  ipratropium-albuterol (DUONEB) 0.5-2.5 (3) MG/3ML SOLN, Take 3 mLs by nebulization every 4 (four) hours as needed (shortness of breath). , Disp: , Rfl:  .  omalizumab (XOLAIR) 150 MG injection, Inject 300 mg into the skin every 30  (thirty) days. Last dose was on 12-10-14 per patient, Disp: , Rfl:  .  ondansetron (ZOFRAN ODT) 4 MG disintegrating tablet, Take 1 tablet (4 mg total) by mouth every 8 (eight) hours as needed for nausea or vomiting., Disp: 20 tablet, Rfl: 0 .  ondansetron (ZOFRAN) 4 MG tablet, Take 1 tablet (4 mg total) by mouth every 6 (six) hours., Disp: 12 tablet, Rfl: 0 .  traMADol (ULTRAM) 50 MG tablet, Take 1 tablet (50 mg total) by mouth every 6 (six) hours as needed., Disp: 15 tablet, Rfl: 0 .  azelastine (ASTELIN) 0.1 % nasal spray, Place 2 sprays into both nostrils 2 (two) times daily. Use in each nostril as directed, Disp: 30 mL, Rfl: 12 .  methylPREDNISolone (MEDROL DOSEPAK) 4 MG TBPK tablet, Take 5 tabs day one and day two, 4 tabs on day two, 3 tabs day three, 2 tabs day four, 1 tab day five, Disp: 21 tablet, Rfl: 3

## 2015-06-05 ENCOUNTER — Encounter (HOSPITAL_COMMUNITY): Payer: Self-pay

## 2015-06-05 ENCOUNTER — Encounter (HOSPITAL_COMMUNITY)
Admission: RE | Admit: 2015-06-05 | Discharge: 2015-06-05 | Disposition: A | Discharge status: Home / Self Care | Payer: No Typology Code available for payment source | Source: Ambulatory Visit | Attending: Otolaryngology | Admitting: Otolaryngology

## 2015-06-05 ENCOUNTER — Other Ambulatory Visit: Payer: Self-pay | Admitting: Otolaryngology

## 2015-06-05 DIAGNOSIS — H7191 Unspecified cholesteatoma, right ear: Secondary | ICD-10-CM | POA: Insufficient documentation

## 2015-06-05 DIAGNOSIS — Z01812 Encounter for preprocedural laboratory examination: Secondary | ICD-10-CM | POA: Insufficient documentation

## 2015-06-05 HISTORY — DX: Sleep apnea, unspecified: G47.30

## 2015-06-05 LAB — CBC
HCT: 50 % (ref 39.0–52.0)
Hemoglobin: 17.7 g/dL — ABNORMAL HIGH (ref 13.0–17.0)
MCH: 30.9 pg (ref 26.0–34.0)
MCHC: 35.4 g/dL (ref 30.0–36.0)
MCV: 87.4 fL (ref 78.0–100.0)
Platelets: 260 10*3/uL (ref 150–400)
RBC: 5.72 MIL/uL (ref 4.22–5.81)
RDW: 13.6 % (ref 11.5–15.5)
WBC: 12.7 10*3/uL — AB (ref 4.0–10.5)

## 2015-06-05 NOTE — Pre-Procedure Instructions (Signed)
    Chad Hurley  06/05/2015      [facility] [place] - [address] [place] [id number] Phone: [phone] Fax: [phone]  [facility] [address]- [place], [place] - [address] AT [facility] [address] [place] [address] Phone: [phone] Fax: [phone]    Your procedure is scheduled on 06/09/15.  [facility].  Call this number if you have problems the morning of surgery:  [phone]   Remember:  Do not eat food or drink liquids after midnight.  Take these medicines the morning of surgery with A SIP OF WATER --prevacid,all inhalers   Do not wear jewelry, make-up or nail polish.  Do not wear lotions, powders, or perfumes.  You may wear deodorant.  Do not shave 48 hours prior to surgery.  Men may shave face and neck.  Do not bring valuables to the hospital.  [facility] is not responsible for any belongings or valuables.  Contacts, dentures or bridgework may not be worn into surgery.  Leave your suitcase in the car.  After surgery it may be brought to your room.  For patients admitted to the hospital, discharge time will be determined by your treatment team.  Patients discharged the day of surgery will not be allowed to drive home.   Name and phone number of your driver: Special instructions:   Please read over the following fact sheets that you were given. Pain Booklet, Coughing and Deep Breathing and Surgical Site Infection Prevention

## 2015-06-09 ENCOUNTER — Encounter (HOSPITAL_COMMUNITY): Payer: Self-pay | Admitting: Surgery

## 2015-06-09 ENCOUNTER — Ambulatory Visit (HOSPITAL_COMMUNITY)
Admission: RE | Admit: 2015-06-09 | Discharge: 2015-06-09 | Disposition: A | Discharge status: Home / Self Care | Payer: No Typology Code available for payment source | Source: Ambulatory Visit | Attending: Otolaryngology | Admitting: Otolaryngology

## 2015-06-09 ENCOUNTER — Ambulatory Visit (HOSPITAL_COMMUNITY): Payer: No Typology Code available for payment source | Admitting: Certified Registered"

## 2015-06-09 ENCOUNTER — Encounter (HOSPITAL_COMMUNITY)
Admission: RE | Disposition: A | Discharge status: Home / Self Care | Payer: Self-pay | Source: Ambulatory Visit | Attending: Otolaryngology

## 2015-06-09 DIAGNOSIS — H902 Conductive hearing loss, unspecified: Secondary | ICD-10-CM | POA: Diagnosis not present

## 2015-06-09 DIAGNOSIS — F4310 Post-traumatic stress disorder, unspecified: Secondary | ICD-10-CM | POA: Insufficient documentation

## 2015-06-09 DIAGNOSIS — H7121 Cholesteatoma of mastoid, right ear: Secondary | ICD-10-CM | POA: Diagnosis not present

## 2015-06-09 HISTORY — PX: BONE ANCHORED HEARING AID IMPLANT: SHX5193

## 2015-06-09 HISTORY — DX: Post-traumatic stress disorder, unspecified: F43.10

## 2015-06-09 SURGERY — INSERTION, BONE ANCHORED HEARING AID
Anesthesia: General | Site: Ear | Laterality: Right

## 2015-06-09 MED ORDER — HYDROCODONE-ACETAMINOPHEN 5-325 MG PO TABS: 1.0000 | ORAL_TABLET | Freq: Four times a day (QID) | ORAL | Status: DC | PRN

## 2015-06-09 MED ORDER — LIDOCAINE-EPINEPHRINE 1 %-1:100000 IJ SOLN
INTRAMUSCULAR | Status: AC
  Filled 2015-06-09: qty 1

## 2015-06-09 MED ORDER — FENTANYL CITRATE (PF) 100 MCG/2ML IJ SOLN
INTRAMUSCULAR | Status: DC | PRN
  Administered 2015-06-09 (×2): 50 ug via INTRAVENOUS
  Administered 2015-06-09: 200 ug via INTRAVENOUS

## 2015-06-09 MED ORDER — ACETAMINOPHEN 10 MG/ML IV SOLN
INTRAVENOUS | Status: DC | PRN
  Administered 2015-06-09: 1000 mg via INTRAVENOUS

## 2015-06-09 MED ORDER — KETOROLAC TROMETHAMINE 60 MG/2ML IM SOLN
INTRAMUSCULAR | Status: AC
  Filled 2015-06-09: qty 2

## 2015-06-09 MED ORDER — KETOROLAC TROMETHAMINE 60 MG/2ML IM SOLN: 60.0000 mg | Freq: Once | INTRAMUSCULAR | Status: DC

## 2015-06-09 MED ORDER — DEXAMETHASONE SODIUM PHOSPHATE 10 MG/ML IJ SOLN
INTRAMUSCULAR | Status: DC | PRN
  Administered 2015-06-09: 10 mg via INTRAVENOUS

## 2015-06-09 MED ORDER — TRAMADOL HCL 50 MG PO TABS: 100.0000 mg | ORAL_TABLET | Freq: Four times a day (QID) | ORAL | Status: DC | PRN

## 2015-06-09 MED ORDER — LIDOCAINE HCL (CARDIAC) 20 MG/ML IV SOLN
INTRAVENOUS | Status: DC | PRN
  Administered 2015-06-09: 80 mg via INTRAVENOUS

## 2015-06-09 MED ORDER — ROCURONIUM BROMIDE 50 MG/5ML IV SOLN
INTRAVENOUS | Status: AC
  Filled 2015-06-09: qty 1

## 2015-06-09 MED ORDER — FENTANYL CITRATE (PF) 250 MCG/5ML IJ SOLN
INTRAMUSCULAR | Status: AC
  Filled 2015-06-09: qty 5

## 2015-06-09 MED ORDER — MINERAL OIL LIGHT 100 % EX OIL
TOPICAL_OIL | CUTANEOUS | Status: AC
  Filled 2015-06-09: qty 25

## 2015-06-09 MED ORDER — DEXAMETHASONE SODIUM PHOSPHATE 4 MG/ML IJ SOLN: 10.0000 mg | Freq: Once | INTRAMUSCULAR | Status: DC

## 2015-06-09 MED ORDER — ONDANSETRON HCL 4 MG/2ML IJ SOLN
INTRAMUSCULAR | Status: DC | PRN
  Administered 2015-06-09: 4 mg via INTRAVENOUS

## 2015-06-09 MED ORDER — PROPOFOL 10 MG/ML IV BOLUS
INTRAVENOUS | Status: DC | PRN
  Administered 2015-06-09: 200 mg via INTRAVENOUS

## 2015-06-09 MED ORDER — ROCURONIUM BROMIDE 100 MG/10ML IV SOLN
INTRAVENOUS | Status: DC | PRN
  Administered 2015-06-09: 40 mg via INTRAVENOUS
  Administered 2015-06-09: 10 mg via INTRAVENOUS

## 2015-06-09 MED ORDER — PHENYLEPHRINE HCL 10 MG/ML IJ SOLN
INTRAMUSCULAR | Status: DC | PRN
  Administered 2015-06-09 (×2): 80 ug via INTRAVENOUS

## 2015-06-09 MED ORDER — 0.9 % SODIUM CHLORIDE (POUR BTL) OPTIME
TOPICAL | Status: DC | PRN
  Administered 2015-06-09: 1000 mL

## 2015-06-09 MED ORDER — PROPOFOL 10 MG/ML IV BOLUS
INTRAVENOUS | Status: AC
  Filled 2015-06-09: qty 20

## 2015-06-09 MED ORDER — CEFAZOLIN SODIUM 1 G IJ SOLR
INTRAMUSCULAR | Status: DC | PRN
  Administered 2015-06-09: 2 g via INTRAMUSCULAR

## 2015-06-09 MED ORDER — SUGAMMADEX SODIUM 200 MG/2ML IV SOLN
INTRAVENOUS | Status: AC
  Filled 2015-06-09: qty 2

## 2015-06-09 MED ORDER — MIDAZOLAM HCL 5 MG/5ML IJ SOLN
INTRAMUSCULAR | Status: DC | PRN
  Administered 2015-06-09: 2 mg via INTRAVENOUS

## 2015-06-09 MED ORDER — ACETAMINOPHEN 160 MG/5ML PO SOLN
975.0000 mg | Freq: Once | ORAL | Status: DC
  Filled 2015-06-09: qty 40

## 2015-06-09 MED ORDER — LACTATED RINGERS IV SOLN
INTRAVENOUS | Status: DC
  Administered 2015-06-09 (×2): via INTRAVENOUS

## 2015-06-09 MED ORDER — ACETAMINOPHEN 10 MG/ML IV SOLN
INTRAVENOUS | Status: AC
  Filled 2015-06-09: qty 100

## 2015-06-09 MED ORDER — DEXAMETHASONE SODIUM PHOSPHATE 10 MG/ML IJ SOLN
INTRAMUSCULAR | Status: AC
  Administered 2015-06-09: 10 mg
  Filled 2015-06-09: qty 1

## 2015-06-09 MED ORDER — LIDOCAINE-EPINEPHRINE 1 %-1:100000 IJ SOLN
INTRAMUSCULAR | Status: DC | PRN
  Administered 2015-06-09: 20 mL

## 2015-06-09 MED ORDER — MIDAZOLAM HCL 2 MG/2ML IJ SOLN
INTRAMUSCULAR | Status: AC
  Filled 2015-06-09: qty 2

## 2015-06-09 MED ORDER — PHENYLEPHRINE 40 MCG/ML (10ML) SYRINGE FOR IV PUSH (FOR BLOOD PRESSURE SUPPORT)
PREFILLED_SYRINGE | INTRAVENOUS | Status: AC
  Filled 2015-06-09: qty 10

## 2015-06-09 MED ORDER — SUGAMMADEX SODIUM 200 MG/2ML IV SOLN
INTRAVENOUS | Status: DC | PRN
  Administered 2015-06-09: 200 mg via INTRAVENOUS

## 2015-06-09 SURGICAL SUPPLY — 66 items
ABUTMENT DERMLOCK 4MM W/14MM (Prosthesis and Implant ENT) ×1 IMPLANT
ALLEVYN WOUND CARE (OTIC EAR SUPPLIES) ×4 IMPLANT
BIT DRILL WIDENING 4MM (DRILL) ×1 IMPLANT
BLADE DERMATOME DISP (MISCELLANEOUS) IMPLANT
BLADE SURG 15 STRL LF DISP TIS (BLADE) IMPLANT
BLADE SURG 15 STRL SS (BLADE)
BLADE SURG ROTATE 9660 (MISCELLANEOUS) ×3 IMPLANT
BNDG GAUZE ELAST 4 BULKY (GAUZE/BANDAGES/DRESSINGS) ×2 IMPLANT
CANISTER SUCTION 2500CC (MISCELLANEOUS) ×3 IMPLANT
CAP HEALING (MISCELLANEOUS) IMPLANT
CAP HEALING W/PLUG 030MM (MISCELLANEOUS) ×2 IMPLANT
CLEANER TIP ELECTROSURG 2X2 (MISCELLANEOUS) ×3 IMPLANT
CORDS BIPOLAR (ELECTRODE) ×3 IMPLANT
COVER SURGICAL LIGHT HANDLE (MISCELLANEOUS) ×3 IMPLANT
CRADLE DONUT ADULT HEAD (MISCELLANEOUS) ×2 IMPLANT
DECANTER SPIKE VIAL GLASS SM (MISCELLANEOUS) ×3 IMPLANT
DERMLOCK ABUTMENT 4MM W/14MM (Prosthesis and Implant ENT) ×3 IMPLANT
DISPOSABLE BIOPSY PUNCH ×2 IMPLANT
DRAPE INCISE 23X17 IOBAN STRL (DRAPES) ×1
DRAPE INCISE 23X17 STRL (DRAPES) ×1 IMPLANT
DRAPE INCISE IOBAN 23X17 STRL (DRAPES) ×2 IMPLANT
DRAPE MICROSCOPE LEICA (MISCELLANEOUS) ×2 IMPLANT
DRAPE MICROSCOPE LEICA 46X105 (MISCELLANEOUS) ×2 IMPLANT
DRAPE MICROSCOPE LEICA 54X105 (DRAPE) ×2 IMPLANT
DRAPE PROXIMA HALF (DRAPES) IMPLANT
DRESSING ALLEVYN LITE 5X5 NADH (GAUZE/BANDAGES/DRESSINGS) ×1 IMPLANT
DRILL COUNTERSINK 3MM (DRILL) IMPLANT
DRILL COUNTERSINK 4MM (DRILL) IMPLANT
DRILL GUIDE 3-4MM (DRILL) IMPLANT
DRILL WIDENING 4MM (DRILL) ×3
DRSG ALLEVYN LITE 5X5 NADH (GAUZE/BANDAGES/DRESSINGS) ×3
DRSG EMULSION OIL 3X3 NADH (GAUZE/BANDAGES/DRESSINGS) ×2 IMPLANT
ELECT COATED BLADE 2.86 ST (ELECTRODE) ×3 IMPLANT
ELECT REM PT RETURN 9FT ADLT (ELECTROSURGICAL) ×3
ELECTRODE REM PT RTRN 9FT ADLT (ELECTROSURGICAL) ×2 IMPLANT
FORCEPS BIPOLAR SPETZLER 8 1.0 (NEUROSURGERY SUPPLIES) ×2 IMPLANT
GAUZE SPONGE 4X4 16PLY XRAY LF (GAUZE/BANDAGES/DRESSINGS) ×4 IMPLANT
GLOVE BIO SURGEON STRL SZ7.5 (GLOVE) ×4 IMPLANT
GLOVE SURG SS PI 8.0 STRL IVOR (GLOVE) ×4 IMPLANT
GOWN STRL REUS W/ TWL LRG LVL3 (GOWN DISPOSABLE) ×4 IMPLANT
GOWN STRL REUS W/TWL LRG LVL3 (GOWN DISPOSABLE) ×6
GUIDE DRILL EAR BAHA 3MM 4MM (MISCELLANEOUS) ×2 IMPLANT
KIT BASIN OR (CUSTOM PROCEDURE TRAY) ×3 IMPLANT
KIT ROOM TURNOVER OR (KITS) ×3 IMPLANT
MARKER SKIN DUAL TIP RULER LAB (MISCELLANEOUS) ×2 IMPLANT
NDL HYPO 25GX1X1/2 BEV (NEEDLE) IMPLANT
NEEDLE 22X1 1/2 (OR ONLY) (NEEDLE) ×2 IMPLANT
NEEDLE HYPO 25GX1X1/2 BEV (NEEDLE) ×3 IMPLANT
NS IRRIG 1000ML POUR BTL (IV SOLUTION) ×3 IMPLANT
PACK SONY COLOR PRIN UPC 1010 (COPIER/FAXPRODUCTS) ×2 IMPLANT
PAD ARMBOARD 7.5X6 YLW CONV (MISCELLANEOUS) ×4 IMPLANT
PENCIL BUTTON HOLSTER BLD 10FT (ELECTRODE) ×3 IMPLANT
PROCESSOR BAHA 5 BROWN (OTOLOGIC RELATED) ×2 IMPLANT
PUNCH BIOPSY 4MM COCHLEAR (MISCELLANEOUS) IMPLANT
SPONGE GAUZE 4X4 12PLY STER LF (GAUZE/BANDAGES/DRESSINGS) ×2 IMPLANT
SUT BONE WAX W31G (SUTURE) IMPLANT
SUT ETHILON 4 0 PS 2 18 (SUTURE) ×5 IMPLANT
SUT PLAIN 6 0 TG1408 (SUTURE) ×4 IMPLANT
SUT VIC AB 4-0 P-3 18X BRD (SUTURE) ×7 IMPLANT
SUT VIC AB 4-0 P3 18 (SUTURE) ×12
SYR BULB 3OZ (MISCELLANEOUS) ×3 IMPLANT
TAPE UMBILICAL 1/8 X36 TWILL (MISCELLANEOUS) ×2 IMPLANT
TOWEL OR 17X24 6PK STRL BLUE (TOWEL DISPOSABLE) ×1 IMPLANT
TRAY ENT MC OR (CUSTOM PROCEDURE TRAY) ×3 IMPLANT
WATER STERILE IRR 1000ML POUR (IV SOLUTION) ×1 IMPLANT
WIPE INSTRUMENT VISIWIPE 73X73 (MISCELLANEOUS) ×1 IMPLANT

## 2015-06-09 NOTE — Transfer of Care (Signed)
Immediate Anesthesia Transfer of Care Note  Patient: Chad Hurley  Procedure(s) Performed: Procedure(s): RIGHT MASTOIDECTOMY (Right) RIGHT BONE ANCHORED HEARING AID (BAHA) IMPLANT (Right)  Patient Location: PACU  Anesthesia Type:General  Level of Consciousness: awake, alert , oriented and patient cooperative  Airway & Oxygen Therapy: Patient Spontanous Breathing  Post-op Assessment: Report given to RN, Post -op Vital signs reviewed and stable and Patient moving all extremities X 4  Post vital signs: Reviewed and stable  Last Vitals:  Filed Vitals:   06/09/15 0915  BP: 136/99  Pulse: 91  Temp: 36.7 C  Resp: 20    Complications: No apparent anesthesia complications

## 2015-06-09 NOTE — Brief Op Note (Signed)
06/09/2015  1:16 PM  PATIENT:  Margretta Dittyavis J Trenkamp  40 y.o. male  PRE-OPERATIVE DIAGNOSIS:  right cholestoma, right conductive hearing loss  POST-OPERATIVE DIAGNOSIS:  same  PROCEDURE:  Procedure(s): RIGHT MASTOID DEBRIDEMENT; RIGHT BONE ANCHORED HEARING AID (BAHA) IMPLANT (Right)  SURGEON:  Surgeon(s) and Role:    * Christia Readingwight Domanique Luckett, MD - Primary  PHYSICIAN ASSISTANT:   ASSISTANTS: none   ANESTHESIA:   general  EBL:  Total I/O In: 1400 [I.V.:1400] Out: 10 [Blood:10]  BLOOD ADMINISTERED:none  DRAINS: none   LOCAL MEDICATIONS USED:  LIDOCAINE   SPECIMEN:  Source of Specimen:  right mastoid cholesteatoma pearl  DISPOSITION OF SPECIMEN:  PATHOLOGY  COUNTS:  YES  TOURNIQUET:  * No tourniquets in log *  DICTATION: .Other Dictation: Dictation Number [id number]  PLAN OF CARE: Discharge to home after PACU  PATIENT DISPOSITION:  PACU - hemodynamically stable.   Delay start of Pharmacological VTE agent (>24hrs) due to surgical blood loss or risk of bleeding: yes

## 2015-06-09 NOTE — Anesthesia Preprocedure Evaluation (Addendum)
Anesthesia Evaluation  Patient identified by MRN, date of birth, ID band Patient awake    Reviewed: Allergy & Precautions, H&P , Patient's Chart, lab work & pertinent test results, reviewed documented beta blocker date and time   Airway Mallampati: II  TM Distance: >3 FB Neck ROM: full    Dental no notable dental hx.    Pulmonary asthma , sleep apnea and Continuous Positive Airway Pressure Ventilation ,    Pulmonary exam normal breath sounds clear to auscultation       Cardiovascular  Rhythm:regular Rate:Normal     Neuro/Psych    GI/Hepatic GERD  ,  Endo/Other    Renal/GU      Musculoskeletal   Abdominal   Peds  Hematology   Anesthesia Other Findings   Reproductive/Obstetrics                            Anesthesia Physical Anesthesia Plan  ASA: II  Anesthesia Plan: General   Post-op Pain Management:    Induction: Intravenous  Airway Management Planned: Oral ETT  Additional Equipment:   Intra-op Plan:   Post-operative Plan: Extubation in OR  Informed Consent: I have reviewed the patients History and Physical, chart, labs and discussed the procedure including the risks, benefits and alternatives for the proposed anesthesia with the patient or authorized representative who has indicated his/her understanding and acceptance.   Dental Advisory Given and Dental advisory given  Plan Discussed with: CRNA and Surgeon  Anesthesia Plan Comments: (  Discussed general anesthesia, including possible nausea, instrumentation of airway, sore throat,pulmonary aspiration, etc. I asked if the were any outstanding questions, or  concerns before we proceeded. )        Anesthesia Quick Evaluation

## 2015-06-09 NOTE — H&P (Signed)
Chad Hurley is an 40 y.o. male.   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  He has more recently had a recurrence with a cholesteatoma pearl in the right mastoid posteriorly.  He presents for revision mastoidectomy.  In addition, we will be treating his conductive hearing loss with placement of a BAHA.  Past Medical History  Diagnosis Date  . Chronic otitis externa   . Asthma   . Shingles   . Sinusitis   . Heat stroke     age 89  . Depression   . ADHD (attention deficit hyperactivity disorder)   . H/O ETOH abuse     06/27/09 sobriety date  . Sleep apnea     occ cpap  . PTSD (post-traumatic stress disorder)     Past Surgical History  Procedure Laterality Date  . Septoplasty  2008  . Bilateral tympanoplasty      and removal of cholestereatoma  . Nasal turbinate reduction  2013    Grenda Lora    Family History  Problem Relation Age of Onset  . Stomach cancer      grandfather  . Allergic rhinitis Father   . Sleep apnea Father    Social History:  reports that he has never smoked. He has never used smokeless tobacco. He reports that he uses illicit drugs (Marijuana). He reports that he does not drink alcohol.  Allergies:  Allergies  Allergen Reactions  . Bacillus Shortness Of Breath    Bacillus Anthracis: Anthrax  . Beta Adrenergic Blockers     Long acting beta 2 agonists   . Other Other (See Comments)    The patient is a recovering alcoholic and prefers nothing with alcohol in it. He also requested no opioids he'd prefer tramadol.     Medications Prior to Admission  Medication Sig Dispense Refill  . beclomethasone (QVAR) 80 MCG/ACT inhaler Inhale 4 puffs into the lungs 2 (two) times daily.     . cetirizine (ZYRTEC) 10 MG tablet Take 20 mg by mouth at bedtime.    . ciprofloxacin (CIPRO) 500 MG tablet Take 500  mg by mouth 3 (three) times daily.    . cyclobenzaprine (FLEXERIL) 10 MG tablet Take 10 mg by mouth 3 (three) times daily as needed for muscle spasms.    Marland Kitchen dexamethasone (DECADRON) 4 MG tablet Take 4 mg by mouth daily as needed. For asthma    . diphenhydrAMINE (BENADRYL) 25 MG tablet Take 25 mg by mouth every 6 (six) hours as needed for sleep. Patient takes 100 mg 4 tablets at bedtime if needed for sleep    . lansoprazole (PREVACID) 30 MG capsule Take 60 mg by mouth daily at 12 noon.    Marland Kitchen omalizumab (XOLAIR) 150 MG injection Inject 300 mg into the skin every 30 (thirty) days. Last dose was on 12-10-14 per patient    . traZODone (DESYREL) 150 MG tablet Take 100 mg by mouth at bedtime.     Marland Kitchen albuterol (PROVENTIL HFA;VENTOLIN HFA) 108 (90 BASE) MCG/ACT inhaler Inhale 2 puffs into the lungs every 4 (four) hours as needed for wheezing. (Patient taking differently: Inhale 2 puffs into the lungs every 4 (four) hours as needed for wheezing. For shortness of breath) 2 Inhaler 6  . azelastine (ASTELIN) 0.1 % nasal spray Place 2 sprays into both nostrils 2 (two) times daily. Use in each nostril as  directed (Patient not taking: Reported on 06/04/2015) 30 mL 12  . baclofen (LIORESAL) 10 MG tablet Take 10 mg by mouth daily as needed for muscle spasms.    . fluticasone (FLONASE) 50 MCG/ACT nasal spray Place 2 sprays into both nostrils daily as needed for allergies or rhinitis.    Marland Kitchen. HYDROcodone-acetaminophen (NORCO/VICODIN) 5-325 MG tablet Take 1-2 tablets by mouth every 4 (four) hours as needed. (Patient not taking: Reported on 06/04/2015) 16 tablet 0  . ipratropium-albuterol (DUONEB) 0.5-2.5 (3) MG/3ML SOLN Take 3 mLs by nebulization every 4 (four) hours as needed (shortness of breath).     . methylPREDNISolone (MEDROL DOSEPAK) 4 MG TBPK tablet Take 5 tabs day one and day two, 4 tabs on day two, 3 tabs day three, 2 tabs day four, 1 tab day five (Patient not taking: Reported on 06/04/2015) 21 tablet 3  . ondansetron  (ZOFRAN ODT) 4 MG disintegrating tablet Take 1 tablet (4 mg total) by mouth every 8 (eight) hours as needed for nausea or vomiting. (Patient not taking: Reported on 06/04/2015) 20 tablet 0  . ondansetron (ZOFRAN) 4 MG tablet Take 1 tablet (4 mg total) by mouth every 6 (six) hours. (Patient not taking: Reported on 06/04/2015) 12 tablet 0  . traMADol (ULTRAM) 50 MG tablet Take 1 tablet (50 mg total) by mouth every 6 (six) hours as needed. (Patient not taking: Reported on 06/04/2015) 15 tablet 0    No results found for this or any previous visit (from the past 48 hour(s)). No results found.  Review of Systems  All other systems reviewed and are negative.   Blood pressure 136/99, pulse 91, temperature 98 F (36.7 C), temperature source Oral, resp. rate 20, height 5\' 8"  (1.727 m), weight 89.5 kg (197 lb 5 oz), SpO2 96 %. Physical Exam  Constitutional: He is oriented to person, place, and time. He appears well-developed and well-nourished. No distress.  HENT:  Head: Normocephalic and atraumatic.  Left Ear: External ear normal.  Nose: Nose normal.  Mouth/Throat: Oropharynx is clear and moist.  Right ear with skin pearl in posterior mastoid, s/p open cavity mastoidectomy.  Eyes: Conjunctivae and EOM are normal. Pupils are equal, round, and reactive to light.  Neck: Normal range of motion. Neck supple.  Cardiovascular: Normal rate.   Respiratory: Effort normal.  Musculoskeletal: Normal range of motion.  Neurological: He is alert and oriented to person, place, and time. No cranial nerve deficit.  Skin: Skin is warm and dry.  Psychiatric: He has a normal mood and affect. His behavior is normal. Judgment and thought content normal.     Assessment/Plan Right mastoid cholesteatoma and conductive hearing loss. To OR for revision right mastoidectomy and placement of BAHA.  Christia ReadingBATES, Kayne Yuhas, MD 06/09/2015, 11:04 AM

## 2015-06-09 NOTE — Anesthesia Postprocedure Evaluation (Signed)
Anesthesia Post Note  Patient: Chad Hurley  Procedure(s) Performed: Procedure(s) (LRB): RIGHT MASTOID DEBRIDEMENT; RIGHT BONE ANCHORED HEARING AID (BAHA) IMPLANT (Right)  Patient location during evaluation: PACU Anesthesia Type: General Level of consciousness: sedated Pain management: satisfactory to patient Vital Signs Assessment: post-procedure vital signs reviewed and stable Respiratory status: spontaneous breathing Cardiovascular status: stable Anesthetic complications: no    Last Vitals:  Filed Vitals:   06/09/15 1320 06/09/15 1342  BP: 145/94 147/97  Pulse:  91  Temp: 36.6 C   Resp: 15     Last Pain:  Filed Vitals:   06/09/15 1345  PainSc: 0-No pain                 Ashrita Chrismer EDWARD

## 2015-06-09 NOTE — Anesthesia Procedure Notes (Signed)
Procedure Name: Intubation Date/Time: 06/09/2015 11:22 AM Performed by: Rosiland OzMEYERS, Lynsee Wands Pre-anesthesia Checklist: Patient identified, Timeout performed, Emergency Drugs available, Suction available and Patient being monitored Patient Re-evaluated:Patient Re-evaluated prior to inductionOxygen Delivery Method: Circle system utilized Preoxygenation: Pre-oxygenation with 100% oxygen Intubation Type: IV induction Ventilation: Mask ventilation without difficulty and Oral airway inserted - appropriate to patient size Laryngoscope Size: Glidescope and 4 Grade View: Grade I Tube size: 7.5 mm Number of attempts: 1 Airway Equipment and Method: Stylet and Video-laryngoscopy Placement Confirmation: ETT inserted through vocal cords under direct vision,  breath sounds checked- equal and bilateral and positive ETCO2 Secured at: 22 cm Tube secured with: Tape Dental Injury: Teeth and Oropharynx as per pre-operative assessment

## 2015-06-10 ENCOUNTER — Encounter (HOSPITAL_COMMUNITY): Payer: Self-pay | Admitting: Otolaryngology

## 2015-06-10 NOTE — Op Note (Signed)
NAMArdine Bjork:  Chad Hurley, Chad Hurley                 ACCOUNT NO.:  [id number]  MEDICAL RECORD NO.:  [id number]  LOCATION:  [facility]                         FACILITY:  [facility]  PHYSICIAN:  Antony Contraswight D Marx Doig, MD     DATE OF BIRTH:  [birth year]  DATE OF PROCEDURE:  06/09/2015 DATE OF DISCHARGE:  06/09/2015                              OPERATIVE REPORT   PREOPERATIVE DIAGNOSES: 1. History of right ear cholesteatoma, status post open cavity     mastoidectomy. 2. Cholesteatoma recurrence of right mastoid. 3. Right conductive hearing loss.  POSTOPERATIVE DIAGNOSES: 1. History of right ear cholesteatoma, status post open cavity     mastoidectomy. 2. Cholesteatoma recurrence of right mastoid. 3. Right conductive hearing loss.  PROCEDURES: 1. Placement of right bone anchored hearing aid. 2. Right mastoid debridement under operating microscope.  SURGEON:  Antony Contraswight D Kinlie Janice, MD  ANESTHESIA:  General endotracheal anesthesia.  COMPLICATIONS:  None.  INDICATION:  The patient is a 40 year old male with history of ear disease, having required two previous ear surgeries for cholesteatoma, the last being an open cavity mastoidectomy.  He has had associated conductive hearing loss since that time due to ossicular involvement. More recently, he had a cholesteatoma pearl, become apparent in the right posterior mastoid.  He presents to the operating room for surgical management, but also for placement of a bone anchored hearing aid to rehabilitate hearing.  FINDINGS:  There was a cholesteatoma pearl in the right posterior mastoid that dissected away very easily from the underlying skin using an elevator and did not require any drilling or deeper dissection.  A 14- mm bone anchored hearing aid was placed in the standard fashion.  DESCRIPTION OF PROCEDURE:  The patient was identified in the holding room and informed consent having been obtained including the discussion of risks, benefits, alternatives, the patient was  brought to the operative suite, put on the operative table in supine position. Anesthesia was induced and the patient was intubated by the Anesthesia team without difficulty.  The patient was given intravenous antibiotics during the case.  The eyes were taped closed and the bed was turned 90 degrees from anesthesia.  The hair behind the right ear was shaved to allow placement of the bone anchored hearing aid.  The device template was then measured to the proper position and the skin marked with a marking pen.  The posterior auricular incision was injected with 1% lidocaine with 1:100,000 epinephrine.  The right ear was prepped and draped in sterile fashion.  Under the operating microscope, the ear canal was examined.  The skin surrounding the cholesteatoma pearl was injected with local anesthetic.  The pearl was unable to be dissected free using an elevator without much trouble and was passed to nursing for pathology.  The depth of the site was carefully examined, no additional skin debris was seen.  At this point, the postauricular incision was made again through the previous site using a 15-blade scalpel and extended through the subcutaneous tissues down to the mastoid periosteum.  The flap was elevated posteriorly back beyond the site for the bone anchored hearing aid placement.  Of note, prior to injecting the ear and prepping, the  depth of the scalp tissue was measured with a needle as exactly 10 mm.  At this point, the needle was placed back through the marked site and used to mark the underlying bone with a marking pen.  A cruciate incision was made through the periosteum using a 15-blade scalpel and the periosteum was elevated exposing the underlying bone.  The Baha drill was then used to drill the 3-mm hole and then widened of 4-mm hole.  It was then countersink.  Finally, the 14-mm implant was loaded on the drill and then screwed into the hole on the standard settings.  At this  point, soft tissues were pulled back over top of the abutment and the incision was closed loosely with 3-0 Vicryl suture in a simple interrupted fashion in the subcutaneous layer. At this point, the abutment was marked on the skin with marking pen and the 5-mm skin punch was used to make a hole through the skin over the abutment.  The skin was then pressed down around the abutment until in its proper position.  Incision was then further closed with 3-0 Vicryl suture in the subcutaneous layer and then a 6-0 plain gut in a simple running fashion on the skin.  The patient was then cleaned off and drapes removed.  The healing dressing was placed followed by the healing cap.  A mastoid dressing was then added to the head in standard fashion. He was then returned to anesthesia for wake-up and was extubated and moved to the recovery room in stable condition.     Antony Contras, MD     DDB/MEDQ  D:  06/09/2015  T:  06/10/2015  Job:  [id number]

## 2015-06-11 ENCOUNTER — Encounter (HOSPITAL_COMMUNITY): Payer: Self-pay | Admitting: Otolaryngology

## 2015-06-12 ENCOUNTER — Encounter (HOSPITAL_COMMUNITY): Payer: Self-pay | Admitting: Otolaryngology

## 2016-11-03 ENCOUNTER — Other Ambulatory Visit: Payer: Self-pay | Admitting: Otolaryngology

## 2016-11-03 NOTE — Pre-Procedure Instructions (Signed)
RITCHARD HARKLESS  11/03/2016      [facility] - [place], [place] - [address] [place] [address] Phone: [phone] Fax: [phone]  [facility] - [place], [place] - [address] AT [facility] [address] & [address] [place] [address] Phone: [phone] Fax: [phone]    Your procedure is scheduled on November 09, 2106.  Report to [facility] at 530 AM.  Call this number if you have problems the morning of surgery:  [phone]   Remember:  Do not eat food or drink liquids after midnight.  Take these medicines the morning of surgery with A SIP OF WATER  Beclomethasone (QVAR) inhaler- <bring with you>, cyclobenzaprine (flexeril)-if needed, fluticasone (flonase) nasal spray, Lansoprazole (prevacid).  7 days prior to surgery STOP taking any Aspirin, Aleve, Naproxen, Ibuprofen, Motrin, Advil, Goody's, BC's, all herbal medications, fish oil, and all vitamins   Do not wear jewelry, make-up or nail polish.  Do not wear lotions, powders, or perfumes, or deoderant.  Men may shave face and neck.  Do not bring valuables to the hospital.  [facility] is not responsible for any belongings or valuables.  Contacts, dentures or bridgework may not be worn into surgery.  Leave your suitcase in the car.  After surgery it may be brought to your room.  For patients admitted to the hospital, discharge time will be determined by your treatment team.  Patients discharged the day of surgery will not be allowed to drive home.   Special instructions:   [facility]- Preparing For Surgery  Before surgery, you can play an important role. Because skin is not sterile, your skin needs to be as free of germs as possible. You can reduce the number of germs on your skin by washing with CHG (chlorahexidine gluconate) Soap before surgery.  CHG is an antiseptic cleaner which kills germs and  bonds with the skin to continue killing germs even after washing.  Please do not use if you have an allergy to CHG or antibacterial soaps. If your skin becomes reddened/irritated stop using the CHG.  Do not shave (including legs and underarms) for at least 48 hours prior to first CHG shower. It is OK to shave your face.  Please follow these instructions carefully.   1. Shower the NIGHT BEFORE SURGERY and the MORNING OF SURGERY with CHG.   2. If you chose to wash your hair, wash your hair first as usual with your normal shampoo.  3. After you shampoo, rinse your hair and body thoroughly to remove the shampoo.  4. Use CHG as you would any other liquid soap. You can apply CHG directly to the skin and wash gently with a scrungie or a clean washcloth.   5. Apply the CHG Soap to your body ONLY FROM THE NECK DOWN.  Do not use on open wounds or open sores. Avoid contact with your eyes, ears, mouth and genitals (private parts). Wash genitals (private parts) with your normal soap.  6. Wash thoroughly, paying special attention to the area where your surgery will be performed.  7. Thoroughly rinse your body with warm water from the neck down.  8. DO NOT shower/wash with your normal soap after using and rinsing off the CHG Soap.  9. Pat yourself dry with a CLEAN TOWEL.   10. Wear CLEAN PAJAMAS   11. Place CLEAN SHEETS on your  bed the night of your first shower and DO NOT SLEEP WITH PETS.    Day of Surgery: Do not apply any deodorants/lotions. Please wear clean clothes to the hospital/surgery center.     Please read over the following fact sheets that you were given. Pain Booklet, Coughing and Deep Breathing and Surgical Site Infection Prevention

## 2016-11-04 ENCOUNTER — Encounter (HOSPITAL_COMMUNITY): Payer: Self-pay

## 2016-11-04 ENCOUNTER — Encounter (HOSPITAL_COMMUNITY)
Admission: RE | Admit: 2016-11-04 | Discharge: 2016-11-04 | Disposition: A | Discharge status: Home / Self Care | Source: Ambulatory Visit | Attending: Otolaryngology | Admitting: Otolaryngology

## 2016-11-04 DIAGNOSIS — F4310 Post-traumatic stress disorder, unspecified: Secondary | ICD-10-CM | POA: Diagnosis not present

## 2016-11-04 DIAGNOSIS — J309 Allergic rhinitis, unspecified: Secondary | ICD-10-CM | POA: Insufficient documentation

## 2016-11-04 DIAGNOSIS — F1020 Alcohol dependence, uncomplicated: Secondary | ICD-10-CM | POA: Diagnosis not present

## 2016-11-04 DIAGNOSIS — Z01812 Encounter for preprocedural laboratory examination: Secondary | ICD-10-CM | POA: Insufficient documentation

## 2016-11-04 DIAGNOSIS — F329 Major depressive disorder, single episode, unspecified: Secondary | ICD-10-CM | POA: Diagnosis not present

## 2016-11-04 DIAGNOSIS — K219 Gastro-esophageal reflux disease without esophagitis: Secondary | ICD-10-CM | POA: Diagnosis not present

## 2016-11-04 DIAGNOSIS — J45909 Unspecified asthma, uncomplicated: Secondary | ICD-10-CM | POA: Insufficient documentation

## 2016-11-04 DIAGNOSIS — H902 Conductive hearing loss, unspecified: Secondary | ICD-10-CM | POA: Insufficient documentation

## 2016-11-04 HISTORY — DX: Gastro-esophageal reflux disease without esophagitis: K21.9

## 2016-11-04 HISTORY — DX: Personal history of urinary calculi: Z87.442

## 2016-11-04 HISTORY — DX: Pneumonia, unspecified organism: J18.9

## 2016-11-04 LAB — COMPREHENSIVE METABOLIC PANEL
ALBUMIN: 4.1 g/dL (ref 3.5–5.0)
ALK PHOS: 72 U/L (ref 38–126)
ALT: 45 U/L (ref 17–63)
ANION GAP: 11 (ref 5–15)
AST: 28 U/L (ref 15–41)
BUN: 14 mg/dL (ref 6–20)
CALCIUM: 9.1 mg/dL (ref 8.9–10.3)
CO2: 22 mmol/L (ref 22–32)
Chloride: 108 mmol/L (ref 101–111)
Creatinine, Ser: 0.92 mg/dL (ref 0.61–1.24)
GFR calc Af Amer: >60 mL/min (ref >60)
GFR calc non Af Amer: >60 mL/min (ref >60)
GLUCOSE: 92 mg/dL (ref 65–99)
Potassium: 3.6 mmol/L (ref 3.5–5.1)
SODIUM: 141 mmol/L (ref 135–145)
Total Bilirubin: 1 mg/dL (ref 0.3–1.2)
Total Protein: 6.8 g/dL (ref 6.5–8.1)

## 2016-11-04 LAB — CBC
HCT: 49.6 % (ref 39.0–52.0)
HEMOGLOBIN: 17.7 g/dL — AB (ref 13.0–17.0)
MCH: 29.4 pg (ref 26.0–34.0)
MCHC: 35.7 g/dL (ref 30.0–36.0)
MCV: 82.3 fL (ref 78.0–100.0)
Platelets: 282 10*3/uL (ref 150–400)
RBC: 6.03 MIL/uL — AB (ref 4.22–5.81)
RDW: 14.2 % (ref 11.5–15.5)
WBC: 15.7 10*3/uL — ABNORMAL HIGH (ref 4.0–10.5)

## 2016-11-04 NOTE — Progress Notes (Signed)
PCP - Dr. Gerlene Fee at [facility] Specialist - Dr. Leta Baptist at [facility]  Chest x-ray - n/a EKG - n/a Stress Test - patient denies ECHO - patient denies Cardiac Cath - patient denies  Sleep Study - patient states he had this done through the [place] and is positive for OSA CPAP - patient states he does not currently wear, but has one    Patient denies shortness of breath, fever, cough and chest pain at PAT appointment   Patient verbalized understanding of instructions that were given to them at the PAT appointment. Patient was also instructed that they will need to review over the PAT instructions again at home before surgery.

## 2016-11-05 ENCOUNTER — Encounter (HOSPITAL_COMMUNITY): Payer: Self-pay

## 2016-11-05 NOTE — Progress Notes (Signed)
Anesthesia chart review: Patient is a 41 year old male scheduled for replacement of right bone anchoring hearing aid (BAHA) on 11/08/2016 by Dr. Christia Reading.  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 BMI is consistent with obesity.      - PCP is Dr. Gerlene Fee at [facility].  - Allergist is "Dr. Leta Baptist" (abbreviated name per patient) at the [facility]. - Pulmonologist is Dr. Paticia Stack at the [facility]. Locally, he has seen Dr. Max Fickle.  Meds include albuterol, Qvar, Flexeril, dexamethasone 4 mg PRN asthma attacks, Flonase, Prevacid, Mepolizumab. He said he took two this week. (Typically may take up to 3 tablets per month.)  BP (!) 116/92   Pulse 70   Temp 36.8 C   Resp 20   Ht 5\' 8"  (1.727 m)   Wt 210 lb 6.4 oz (95.4 kg)   SpO2 99%   BMI 31.99 kg/m   Preoperative labs noted. WBC 15.7K (previously WBC 12.7 on 06/05/15). H/H 17.7/49.6. CMET WNL. I called and spoke with patient. He denied SOB, fever, cough, chest pain, diarrhea, rash. Had taken Decadron (2 tabs) earlier this week since he thought an asthma exacerbation may be coming on, but currently denied wheezing or URI symptoms (ie, sore throat). Patient sounds well on the phone---no audible wheezing, cough, or conversational dyspnea noted. 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 He does not feel acutely ill and stated that right facial cellulitis has since resolved.   I spoke with Elizebeth Brooking at Dr. Jenne Pane office regarding  WBC results and details of my conversation with patient. Subjectively, patient without symptoms of acute illness. Recent Decadron use may be contributing--he also reports history of mild chronic leukocytosis but I don't have physician documentation verifying this or not. If no acute changes then I would anticipate that he could proceed as planned from an anesthesia standpoint. If Dr. Jenne Pane has concerns with leukocytosis then I asked that staff let patient know. I also asked patient to update anesthesia staff about any additional steroid use prior to surgery.  Velna Ochs [facility]/Anesthesiology Phone [phone] 11/05/2016 10:51 AM

## 2016-11-08 ENCOUNTER — Ambulatory Visit (HOSPITAL_COMMUNITY)
Admission: RE | Admit: 2016-11-08 | Discharge: 2016-11-08 | Disposition: A | Discharge status: Home / Self Care | Source: Ambulatory Visit | Attending: Otolaryngology | Admitting: Otolaryngology

## 2016-11-08 ENCOUNTER — Ambulatory Visit (HOSPITAL_COMMUNITY): Admitting: Certified Registered"

## 2016-11-08 ENCOUNTER — Encounter (HOSPITAL_COMMUNITY): Payer: Self-pay | Admitting: *Deleted

## 2016-11-08 ENCOUNTER — Encounter (HOSPITAL_COMMUNITY)
Admission: RE | Disposition: A | Discharge status: Home / Self Care | Payer: Self-pay | Source: Ambulatory Visit | Attending: Otolaryngology

## 2016-11-08 ENCOUNTER — Ambulatory Visit (HOSPITAL_COMMUNITY): Admitting: Vascular Surgery

## 2016-11-08 DIAGNOSIS — Z87442 Personal history of urinary calculi: Secondary | ICD-10-CM | POA: Insufficient documentation

## 2016-11-08 DIAGNOSIS — K219 Gastro-esophageal reflux disease without esophagitis: Secondary | ICD-10-CM | POA: Diagnosis not present

## 2016-11-08 DIAGNOSIS — F329 Major depressive disorder, single episode, unspecified: Secondary | ICD-10-CM | POA: Insufficient documentation

## 2016-11-08 DIAGNOSIS — F909 Attention-deficit hyperactivity disorder, unspecified type: Secondary | ICD-10-CM | POA: Diagnosis not present

## 2016-11-08 DIAGNOSIS — J45909 Unspecified asthma, uncomplicated: Secondary | ICD-10-CM | POA: Diagnosis not present

## 2016-11-08 DIAGNOSIS — F4310 Post-traumatic stress disorder, unspecified: Secondary | ICD-10-CM | POA: Insufficient documentation

## 2016-11-08 DIAGNOSIS — H902 Conductive hearing loss, unspecified: Secondary | ICD-10-CM | POA: Diagnosis present

## 2016-11-08 DIAGNOSIS — Z79899 Other long term (current) drug therapy: Secondary | ICD-10-CM | POA: Diagnosis not present

## 2016-11-08 DIAGNOSIS — G4730 Sleep apnea, unspecified: Secondary | ICD-10-CM | POA: Insufficient documentation

## 2016-11-08 HISTORY — PX: BAHA REVISION: SHX5325

## 2016-11-08 SURGERY — REVISION, BONE ANCHORED HEARING AID
Anesthesia: General | Site: Ear | Laterality: Right

## 2016-11-08 MED ORDER — LIDOCAINE HCL (CARDIAC) 20 MG/ML IV SOLN
INTRAVENOUS | Status: DC | PRN
  Administered 2016-11-08: 100 mg via INTRAVENOUS

## 2016-11-08 MED ORDER — ALBUTEROL SULFATE HFA 108 (90 BASE) MCG/ACT IN AERS
INHALATION_SPRAY | RESPIRATORY_TRACT | Status: DC | PRN
  Administered 2016-11-08 (×4): 2 via RESPIRATORY_TRACT

## 2016-11-08 MED ORDER — LACTATED RINGERS IV SOLN
INTRAVENOUS | Status: DC | PRN
  Administered 2016-11-08 (×2): via INTRAVENOUS

## 2016-11-08 MED ORDER — FENTANYL CITRATE (PF) 250 MCG/5ML IJ SOLN
INTRAMUSCULAR | Status: AC
  Filled 2016-11-08: qty 5

## 2016-11-08 MED ORDER — ROCURONIUM BROMIDE 100 MG/10ML IV SOLN
INTRAVENOUS | Status: DC | PRN
  Administered 2016-11-08: 60 mg via INTRAVENOUS

## 2016-11-08 MED ORDER — PROPOFOL 10 MG/ML IV BOLUS
INTRAVENOUS | Status: DC | PRN
  Administered 2016-11-08: 150 mg via INTRAVENOUS

## 2016-11-08 MED ORDER — HYDROMORPHONE HCL 1 MG/ML IJ SOLN: 0.2500 mg | INTRAMUSCULAR | Status: DC | PRN

## 2016-11-08 MED ORDER — CHLORHEXIDINE GLUCONATE CLOTH 2 % EX PADS: 6.0000 | MEDICATED_PAD | Freq: Once | CUTANEOUS | Status: DC

## 2016-11-08 MED ORDER — FENTANYL CITRATE (PF) 100 MCG/2ML IJ SOLN
INTRAMUSCULAR | Status: DC | PRN
  Administered 2016-11-08 (×2): 50 ug via INTRAVENOUS
  Administered 2016-11-08: 150 ug via INTRAVENOUS

## 2016-11-08 MED ORDER — LIDOCAINE-EPINEPHRINE 1 %-1:100000 IJ SOLN
INTRAMUSCULAR | Status: AC
  Filled 2016-11-08: qty 1

## 2016-11-08 MED ORDER — ONDANSETRON HCL 4 MG/2ML IJ SOLN
INTRAMUSCULAR | Status: DC | PRN
  Administered 2016-11-08: 4 mg via INTRAVENOUS

## 2016-11-08 MED ORDER — MINERAL OIL LIGHT 100 % EX OIL
TOPICAL_OIL | CUTANEOUS | Status: AC
  Filled 2016-11-08: qty 25

## 2016-11-08 MED ORDER — 0.9 % SODIUM CHLORIDE (POUR BTL) OPTIME
TOPICAL | Status: DC | PRN
  Administered 2016-11-08: 1000 mL

## 2016-11-08 MED ORDER — ROCURONIUM BROMIDE 10 MG/ML (PF) SYRINGE
PREFILLED_SYRINGE | INTRAVENOUS | Status: AC
  Filled 2016-11-08: qty 5

## 2016-11-08 MED ORDER — MIDAZOLAM HCL 5 MG/5ML IJ SOLN
INTRAMUSCULAR | Status: DC | PRN
  Administered 2016-11-08: 2 mg via INTRAVENOUS

## 2016-11-08 MED ORDER — PROPOFOL 10 MG/ML IV BOLUS
INTRAVENOUS | Status: AC
  Filled 2016-11-08: qty 20

## 2016-11-08 MED ORDER — SUGAMMADEX SODIUM 200 MG/2ML IV SOLN
INTRAVENOUS | Status: DC | PRN
  Administered 2016-11-08: 190 mg via INTRAVENOUS

## 2016-11-08 MED ORDER — DEXMEDETOMIDINE HCL 200 MCG/2ML IV SOLN
INTRAVENOUS | Status: DC | PRN
  Administered 2016-11-08: 8 ug via INTRAVENOUS
  Administered 2016-11-08: 4 ug via INTRAVENOUS
  Administered 2016-11-08 (×2): 8 ug via INTRAVENOUS

## 2016-11-08 MED ORDER — KETOROLAC TROMETHAMINE 15 MG/ML IJ SOLN
30.0000 mg | Freq: Once | INTRAMUSCULAR | Status: AC
  Administered 2016-11-08: 30 mg via INTRAVENOUS

## 2016-11-08 MED ORDER — MIDAZOLAM HCL 2 MG/2ML IJ SOLN
INTRAMUSCULAR | Status: AC
  Filled 2016-11-08: qty 2

## 2016-11-08 MED ORDER — ALBUTEROL SULFATE HFA 108 (90 BASE) MCG/ACT IN AERS
INHALATION_SPRAY | RESPIRATORY_TRACT | Status: AC
  Filled 2016-11-08: qty 6.7

## 2016-11-08 MED ORDER — DEXAMETHASONE SODIUM PHOSPHATE 10 MG/ML IJ SOLN
INTRAMUSCULAR | Status: DC | PRN
  Administered 2016-11-08: 10 mg via INTRAVENOUS

## 2016-11-08 MED ORDER — BACITRACIN ZINC 500 UNIT/GM EX OINT
TOPICAL_OINTMENT | CUTANEOUS | Status: AC
  Filled 2016-11-08: qty 28.35

## 2016-11-08 MED ORDER — LIDOCAINE 2% (20 MG/ML) 5 ML SYRINGE
INTRAMUSCULAR | Status: AC
  Filled 2016-11-08: qty 5

## 2016-11-08 MED ORDER — BACITRACIN ZINC 500 UNIT/GM EX OINT
TOPICAL_OINTMENT | CUTANEOUS | Status: DC | PRN
  Administered 2016-11-08: 1 via TOPICAL

## 2016-11-08 MED ORDER — CEFAZOLIN SODIUM-DEXTROSE 2-3 GM-% IV SOLR
INTRAVENOUS | Status: DC | PRN
  Administered 2016-11-08: 2 g via INTRAVENOUS

## 2016-11-08 MED ORDER — MEPERIDINE HCL 25 MG/ML IJ SOLN: 6.2500 mg | INTRAMUSCULAR | Status: DC | PRN

## 2016-11-08 MED ORDER — LIDOCAINE-EPINEPHRINE 1 %-1:100000 IJ SOLN
INTRAMUSCULAR | Status: DC | PRN
Start: 2016-11-08 — End: 2016-11-08
  Administered 2016-11-08: 3 mL

## 2016-11-08 MED ORDER — KETOROLAC TROMETHAMINE 30 MG/ML IJ SOLN
INTRAMUSCULAR | Status: AC
  Filled 2016-11-08: qty 1

## 2016-11-08 MED ORDER — TRAMADOL HCL 50 MG PO TABS: 50.0000 mg | ORAL_TABLET | Freq: Four times a day (QID) | ORAL | 0 refills | Status: AC | PRN

## 2016-11-08 MED ORDER — ONDANSETRON HCL 4 MG/2ML IJ SOLN: 4.0000 mg | Freq: Once | INTRAMUSCULAR | Status: DC | PRN

## 2016-11-08 SURGICAL SUPPLY — 41 items
BLADE CLIPPER SURG (BLADE) ×1 IMPLANT
BLADE SURG 15 STRL LF DISP TIS (BLADE) IMPLANT
BLADE SURG 15 STRL SS (BLADE) ×3
BNDG GAUZE ELAST 4 BULKY (GAUZE/BANDAGES/DRESSINGS) ×2 IMPLANT
CANISTER SUCT 3000ML PPV (MISCELLANEOUS) ×3 IMPLANT
CLEANER TIP ELECTROSURG 2X2 (MISCELLANEOUS) ×3 IMPLANT
CORD BIPOLAR FORCEPS 12FT (ELECTRODE) ×2 IMPLANT
COVER SURGICAL LIGHT HANDLE (MISCELLANEOUS) ×3 IMPLANT
DECANTER SPIKE VIAL GLASS SM (MISCELLANEOUS) ×3 IMPLANT
DRAPE HALF SHEET 40X57 (DRAPES) IMPLANT
DRAPE INCISE 23X17 IOBAN STRL (DRAPES) ×2
DRAPE INCISE 23X17 STRL (DRAPES) ×1 IMPLANT
DRAPE INCISE IOBAN 23X17 STRL (DRAPES) ×1 IMPLANT
DRILL GUIDE 3-4MM (DRILL) IMPLANT
ELECT COATED BLADE 2.86 ST (ELECTRODE) ×3 IMPLANT
ELECT REM PT RETURN 9FT ADLT (ELECTROSURGICAL) ×3
ELECTRODE REM PT RTRN 9FT ADLT (ELECTROSURGICAL) ×1 IMPLANT
FORCEPS BIPOLAR SPETZLER 8 1.0 (NEUROSURGERY SUPPLIES) ×3 IMPLANT
GAUZE SPONGE 4X4 12PLY STRL (GAUZE/BANDAGES/DRESSINGS) ×2 IMPLANT
GAUZE SPONGE 4X4 16PLY XRAY LF (GAUZE/BANDAGES/DRESSINGS) ×3 IMPLANT
GLOVE BIO SURGEON STRL SZ7.5 (GLOVE) ×6 IMPLANT
GOWN STRL REUS W/ TWL LRG LVL3 (GOWN DISPOSABLE) ×2 IMPLANT
GOWN STRL REUS W/TWL LRG LVL3 (GOWN DISPOSABLE) ×9
KIT BASIN OR (CUSTOM PROCEDURE TRAY) ×3 IMPLANT
KIT ROOM TURNOVER OR (KITS) ×3 IMPLANT
MAGNET COCHLEAR BAHA 4 (Prosthesis and Implant ENT) ×2 IMPLANT
NDL HYPO 25GX1X1/2 BEV (NEEDLE) IMPLANT
NEEDLE HYPO 25GX1X1/2 BEV (NEEDLE) ×3 IMPLANT
NS IRRIG 1000ML POUR BTL (IV SOLUTION) ×3 IMPLANT
PAD ARMBOARD 7.5X6 YLW CONV (MISCELLANEOUS) ×6 IMPLANT
PEN SKIN MARKING BROAD (MISCELLANEOUS) ×3 IMPLANT
PENCIL BUTTON HOLSTER BLD 10FT (ELECTRODE) ×3 IMPLANT
PUNCH BIOPSY 4MM COCHLEAR (MISCELLANEOUS) IMPLANT
SUT BONE WAX W31G (SUTURE) IMPLANT
SUT ETHILON 4 0 PS 2 18 (SUTURE) ×9 IMPLANT
SUT VIC AB 4-0 P-3 18X BRD (SUTURE) ×3 IMPLANT
SUT VIC AB 4-0 P3 18 (SUTURE) ×6
SYR BULB 3OZ (MISCELLANEOUS) ×3 IMPLANT
TOWEL OR 17X24 6PK STRL BLUE (TOWEL DISPOSABLE) ×3 IMPLANT
TRAY ENT MC OR (CUSTOM PROCEDURE TRAY) ×3 IMPLANT
WIPE INSTRUMENT VISIWIPE 73X73 (MISCELLANEOUS) ×3 IMPLANT

## 2016-11-08 NOTE — Brief Op Note (Addendum)
11/08/2016  9:14 AM  PATIENT:  Chad Hurley  41 y.o. male  PRE-OPERATIVE DIAGNOSIS:  CONDUCTIVE HEARING LOSS  POST-OPERATIVE DIAGNOSIS:  CONDUCTIVE HEARING LOSS  PROCEDURE:  Procedure(s) with comments: BONE ANCHORED HEARING AID (BAHA) REVISION (Right) - REPLACEMENT RIGHT BAHA  [phone] Revision Procedure to attach new abutment [phone] Code for abutment  SURGEON:  Surgeon(s) and Role:    * Christia Reading, MD - Primary  PHYSICIAN ASSISTANT:   ASSISTANTS: none   ANESTHESIA:   general  EBL:  Total I/O In: 1000 [I.V.:1000] Out: 50 [Blood:50]  BLOOD ADMINISTERED:none  DRAINS: none   LOCAL MEDICATIONS USED:  LIDOCAINE   SPECIMEN:  No Specimen  DISPOSITION OF SPECIMEN:  N/A  COUNTS:  YES  TOURNIQUET:  * No tourniquets in log *  DICTATION: .Other Dictation: Dictation Number [id number]  PLAN OF CARE: Discharge to home after PACU  PATIENT DISPOSITION:  PACU - hemodynamically stable.   Delay start of Pharmacological VTE agent (>24hrs) due to surgical blood loss or risk of bleeding: no

## 2016-11-08 NOTE — Anesthesia Preprocedure Evaluation (Signed)
Anesthesia Evaluation  Patient identified by MRN, date of birth, ID band Patient awake    Reviewed: Allergy & Precautions, NPO status , Patient's Chart, lab work & pertinent test results  Airway Mallampati: II  TM Distance: >3 FB Neck ROM: Full    Dental   Pulmonary asthma , sleep apnea ,    Pulmonary exam normal        Cardiovascular Normal cardiovascular exam     Neuro/Psych Depression    GI/Hepatic GERD  Medicated and Controlled,  Endo/Other    Renal/GU      Musculoskeletal   Abdominal   Peds  Hematology   Anesthesia Other Findings   Reproductive/Obstetrics                             Anesthesia Physical Anesthesia Plan  ASA: III  Anesthesia Plan: General   Post-op Pain Management:    Induction: Intravenous  PONV Risk Score and Plan: 2 and Ondansetron, Dexamethasone and Treatment may vary due to age or medical condition  Airway Management Planned: Oral ETT  Additional Equipment:   Intra-op Plan:   Post-operative Plan: Extubation in OR  Informed Consent: I have reviewed the patients History and Physical, chart, labs and discussed the procedure including the risks, benefits and alternatives for the proposed anesthesia with the patient or authorized representative who has indicated his/her understanding and acceptance.     Plan Discussed with: CRNA and Surgeon  Anesthesia Plan Comments:         Anesthesia Quick Evaluation

## 2016-11-08 NOTE — Op Note (Signed)
Chad Hurley, Chad Hurley NO.:  [id number]  MEDICAL RECORD NO.:  [id number]  LOCATION:  [facility]                        FACILITY:  [facility]  PHYSICIAN:  Antony Contras, MD     DATE OF BIRTH:  [birth year]  DATE OF PROCEDURE:  11/08/2016 DATE OF DISCHARGE:                              OPERATIVE REPORT   PREOPERATIVE DIAGNOSIS:  Right conductive hearing loss.  POSTOPERATIVE DIAGNOSIS:  Right conductive hearing loss.  PROCEDURE:  Revision right bone anchored hearing aid with placement of Attract implant.  SURGEON:  Antony Contras, MD.  ANESTHESIA:  General endotracheal anesthesia.  COMPLICATIONS:  None.  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  He was left with right conductive hearing loss and perforations of both tympanic membranes.  He underwent placement of a right bone anchored hearing aid some months ago and had a really good result from a hearing standpoint, but developed infection around the implant that would not clear with multiple efforts of systemic and topical antibiotics.  Ultimately, the post had to be removed.  The area has healed nicely and he now presents back to the operating room for revision of the bone anchored hearing aid with placement of the Attract system.  DESCRIPTION OF PROCEDURE:  The patient was identified in the holding room.  Informed consent having been obtained including discussion of risks, benefits, alternatives, the patient was brought to operative suite and put on the operating table in supine position.  Anesthesia was induced, and the patient was intubated by anesthesia team without difficulty.  The patient was given intravenous antibiotics during the case.  The eyes were taped closed and the bed was turned 90 degrees from anesthesia.  The right ear was prepped and draped in sterile fashion.  A needle  was then used to penetrate the skin over the projected implant region to measure depth.  The entire area was then injected with local anesthetic.  The incision was then marked with a marking pen in a reverse C shape.  The hair in the area had been shaved prior to prepping the ear.  Incision was made with a 15 blade scalpel through the skin and extended through the subcutaneous tissues using Bovie electrocautery down to the mastoid bone.  The soft tissue was then elevated in a posterior direction using a periosteal elevator including exposure of the bone anchored hearing aid abutment.  The implant template was used to help estimate positioning.  The flap was then thinned using Bovie electrocautery to account for the size and shape of the implant, getting the skin thickness to about 6 mm.  This was done very carefully and extensively.  At this point, the healing cap screw was backed out and the Attract system was placed, tightening the screw down and then using the torque screwdriver to about 25.  The wound was then copiously irrigated with saline and the flap was laid back down.  It was closed in subcutaneous layer using 4-0 Vicryl suture in a simple interrupted fashion.  The skin was closed with 4-0 nylon in simple running  fashion. The drapes were removed and the patient was cleaned off.  Bacitracin ointment was added to the incision.  A standard mastoid dressing was applied.  He was then turned back to Anesthesia for wake up and was extubated in the recovery room in stable condition.     Antony Contras, MD     DDB/MEDQ  D:  11/08/2016  T:  11/08/2016  Job:  [id number]

## 2016-11-08 NOTE — Anesthesia Procedure Notes (Signed)
Procedure Name: Intubation Date/Time: 11/08/2016 8:07 AM Performed by: Rosiland Oz Pre-anesthesia Checklist: Patient identified, Suction available, Emergency Drugs available, Patient being monitored and Timeout performed Patient Re-evaluated:Patient Re-evaluated prior to induction Oxygen Delivery Method: Circle system utilized Preoxygenation: Pre-oxygenation with 100% oxygen Induction Type: IV induction Ventilation: Oral airway inserted - appropriate to patient size and Two handed mask ventilation required Laryngoscope Size: Miller, 3, Glidescope and 4 Grade View: Grade I Tube type: Oral Tube size: 7.5 mm Number of attempts: 2 Airway Equipment and Method: Stylet and Video-laryngoscopy Placement Confirmation: ETT inserted through vocal cords under direct vision,  positive ETCO2 and breath sounds checked- equal and bilateral Secured at: 21 cm Tube secured with: Tape Dental Injury: Teeth and Oropharynx as per pre-operative assessment  Comments: Attempt with miller 3 x1, grade 4 view, unable to pass blindly. glidescope 4 grade 1 view successful intubation

## 2016-11-08 NOTE — Anesthesia Postprocedure Evaluation (Signed)
Anesthesia Post Note  Patient: Chad Hurley  Procedure(s) Performed: Procedure(s) (LRB): BONE ANCHORED HEARING AID (BAHA) REVISION (Right)     Patient location during evaluation: PACU Anesthesia Type: General Level of consciousness: awake and alert Pain management: pain level controlled Vital Signs Assessment: post-procedure vital signs reviewed and stable Respiratory status: spontaneous breathing, nonlabored ventilation, respiratory function stable and patient connected to nasal cannula oxygen Cardiovascular status: blood pressure returned to baseline and stable Postop Assessment: no signs of nausea or vomiting Anesthetic complications: no    Last Vitals:  Vitals:   11/08/16 0940 11/08/16 0955  BP: 121/76 115/89  Pulse: (!) 111 (!) 111  Resp: 20 (!) 26  Temp:    SpO2: 94% 95%    Last Pain:  Vitals:   11/08/16 0955  TempSrc:   PainSc: 2                  [name] DAVID

## 2016-11-08 NOTE — Transfer of Care (Signed)
Immediate Anesthesia Transfer of Care Note  Patient: Chad Hurley  Procedure(s) Performed: Procedure(s) with comments: BONE ANCHORED HEARING AID (BAHA) REVISION (Right) - REPLACEMENT RIGHT BAHA  Patient Location: PACU  Anesthesia Type:General  Level of Consciousness: awake, alert , oriented and patient cooperative  Airway & Oxygen Therapy: Patient Spontanous Breathing  Post-op Assessment: Report given to RN and Post -op Vital signs reviewed and stable  Post vital signs: Reviewed and stable  Last Vitals:  Vitals:   11/08/16 0544 11/08/16 0925  BP: (!) 154/101 (P) 127/88  Pulse: 94   Resp: 20 (P) 13  Temp: 36.8 C (!) (P) 36.4 C  SpO2: 98%     Last Pain:  Vitals:   11/08/16 0544  TempSrc: Oral      Patients Stated Pain Goal: 8 (11/08/16 0553)  Complications: No apparent anesthesia complications

## 2016-11-08 NOTE — H&P (Signed)
Chad Hurley is an 41 y.o. male.   Chief Complaint: Right conductive hearing loss HPI: 41 year old male with history of cholesteatoma requiring middle ear surgery.  Has TM perforations and conductive hearing loss.  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  He presents for placement of magnetic BAHA.  Past Medical History:  Diagnosis Date  . ADHD (attention deficit hyperactivity disorder)   . Asthma   . Chronic otitis externa   . Depression   . GERD (gastroesophageal reflux disease)   . H/O ETOH abuse    06/27/09 sobriety date  . Heat stroke    age 66  . History of kidney stones   . Pneumonia    "years ago"  . PTSD (post-traumatic stress disorder)   . Shingles   . Sinusitis   . Sleep apnea    occ cpap    Past Surgical History:  Procedure Laterality Date  . bilateral tympanoplasty     and removal of cholestereatoma  . BONE ANCHORED HEARING AID IMPLANT Right 06/09/2015   Procedure: RIGHT MASTOID DEBRIDEMENT; RIGHT BONE ANCHORED HEARING AID (BAHA) IMPLANT;  Surgeon: Christia Reading, MD;  Location: [facility] OR;  Service: ENT;  Laterality: Right;  . KNEE SURGERY Right   . NASAL TURBINATE REDUCTION  2013   [facility]  . SEPTOPLASTY  2008    Family History  Problem Relation Age of Onset  . Stomach cancer Unknown        grandfather  . Allergic rhinitis Father   . Sleep apnea Father    Social History:  reports that he has never smoked. He has never used smokeless tobacco. He reports that he does not drink alcohol or use drugs.  Allergies:  Allergies  Allergen Reactions  . Bacillus Shortness Of Breath    Bacillus Anthracis: Anthrax  . Benzodiazepines Other (See Comments)    Change in mental status   . Clindamycin/Lincomycin Nausea And Vomiting and Other (See Comments)    MYALGIAS "Joint pain that took months to go away"  . Other Other (See Comments)    The patient is a recovering  alcoholic and prefers nothing with alcohol in it. He also requested no opioids he'd prefer tramadol.   . Beta Adrenergic Blockers     Long acting beta 2 agonists  UNSPECIFIED REACTION   . Morphine And Related     UNSPECIFIED REACTION     Medications Prior to Admission  Medication Sig Dispense Refill  . beclomethasone (QVAR) 80 MCG/ACT inhaler Inhale 4 puffs into the lungs 4 (four) times daily as needed (shortness of breath).     . cyclobenzaprine (FLEXERIL) 10 MG tablet Take 10 mg by mouth 3 (three) times daily as needed for muscle spasms.    Marland Kitchen dexamethasone (DECADRON) 4 MG tablet Take 4 mg by mouth daily as needed (asthma attacks).     . diphenhydrAMINE (BENADRYL) 25 MG tablet Take 50-100 mg by mouth at bedtime. 50-100 mg depending on insomnia    . fluticasone (FLONASE) 50 MCG/ACT nasal spray Place 2 sprays into both nostrils daily as needed for allergies or rhinitis.    Marland Kitchen ibuprofen (ADVIL,MOTRIN) 200 MG tablet Take 800 mg by mouth daily as needed for headache.    . lansoprazole (PREVACID) 30 MG capsule Take 60 mg by mouth daily as needed (indigestion).     . Mepolizumab 100 MG SOLR Inject 100 mg into the skin every 30 (thirty) days.  Given at [facility] in [place]  last dose 10-22-16    . albuterol (PROVENTIL HFA;VENTOLIN HFA) 108 (90 BASE) MCG/ACT inhaler Inhale 2 puffs into the lungs every 4 (four) hours as needed for wheezing. (Patient not taking: Reported on 11/02/2016) 2 Inhaler 6  . HYDROcodone-acetaminophen (NORCO/VICODIN) 5-325 MG tablet Take 1-2 tablets by mouth every 6 (six) hours as needed for moderate pain. (Patient not taking: Reported on 11/02/2016) 8 tablet 0  . traMADol (ULTRAM) 50 MG tablet Take 2 tablets (100 mg total) by mouth every 6 (six) hours as needed. (Patient not taking: Reported on 11/02/2016) 40 tablet 0    No results found for this or any previous visit (from the past 48 hour(s)). No results found.  Review of Systems  All other systems reviewed and are  negative.   Blood pressure (!) 154/101, pulse 94, temperature 98.2 F (36.8 C), temperature source Oral, resp. rate 20, weight 201 lb (91.2 kg), SpO2 98 %. Physical Exam  Constitutional: He is oriented to person, place, and time. He appears well-developed and well-nourished. No distress.  HENT:  Head: Normocephalic and atraumatic.  Right Ear: External ear normal.  Left Ear: External ear normal.  Nose: Nose normal.  Mouth/Throat: Oropharynx is clear and moist.  Right post-auricular scar.  Eyes: Pupils are equal, round, and reactive to light. Conjunctivae and EOM are normal.  Neck: Normal range of motion. Neck supple.  Cardiovascular: Normal rate.   Respiratory: Effort normal.  Musculoskeletal: Normal range of motion.  Neurological: He is alert and oriented to person, place, and time. No cranial nerve deficit.  Skin: Skin is warm and dry.  Psychiatric: He has a normal mood and affect. His behavior is normal. Judgment and thought content normal.     Assessment/Plan Right conductive hearing loss To OR for placement of right magnetic BAHA.  Christia Reading, MD 11/08/2016, 7:46 AM

## 2016-11-10 ENCOUNTER — Encounter (HOSPITAL_COMMUNITY): Payer: Self-pay | Admitting: Otolaryngology

## 2018-12-26 ENCOUNTER — Emergency Department (HOSPITAL_COMMUNITY): Payer: No Typology Code available for payment source

## 2018-12-26 ENCOUNTER — Other Ambulatory Visit: Payer: Self-pay

## 2018-12-26 ENCOUNTER — Emergency Department (HOSPITAL_COMMUNITY)
Admission: EM | Admit: 2018-12-26 | Discharge: 2018-12-27 | Disposition: A | Discharge status: Home / Self Care | Payer: No Typology Code available for payment source | Attending: Emergency Medicine | Admitting: Emergency Medicine

## 2018-12-26 ENCOUNTER — Encounter (HOSPITAL_COMMUNITY): Payer: Self-pay | Admitting: Emergency Medicine

## 2018-12-26 DIAGNOSIS — R1031 Right lower quadrant pain: Secondary | ICD-10-CM | POA: Diagnosis present

## 2018-12-26 DIAGNOSIS — R350 Frequency of micturition: Secondary | ICD-10-CM | POA: Diagnosis not present

## 2018-12-26 DIAGNOSIS — Z8709 Personal history of other diseases of the respiratory system: Secondary | ICD-10-CM | POA: Diagnosis not present

## 2018-12-26 DIAGNOSIS — Z79899 Other long term (current) drug therapy: Secondary | ICD-10-CM | POA: Insufficient documentation

## 2018-12-26 DIAGNOSIS — R300 Dysuria: Secondary | ICD-10-CM | POA: Insufficient documentation

## 2018-12-26 DIAGNOSIS — B029 Zoster without complications: Secondary | ICD-10-CM

## 2018-12-26 DIAGNOSIS — Z87442 Personal history of urinary calculi: Secondary | ICD-10-CM | POA: Diagnosis not present

## 2018-12-26 DIAGNOSIS — F909 Attention-deficit hyperactivity disorder, unspecified type: Secondary | ICD-10-CM | POA: Insufficient documentation

## 2018-12-26 LAB — CBC
HCT: 54.4 % — ABNORMAL HIGH (ref 39.0–52.0)
Hemoglobin: 18.9 g/dL — ABNORMAL HIGH (ref 13.0–17.0)
MCH: 30.9 pg (ref 26.0–34.0)
MCHC: 34.7 g/dL (ref 30.0–36.0)
MCV: 89 fL (ref 80.0–100.0)
Platelets: 295 10*3/uL (ref 150–400)
RBC: 6.11 MIL/uL — ABNORMAL HIGH (ref 4.22–5.81)
RDW: 13.7 % (ref 11.5–15.5)
WBC: 18.2 10*3/uL — ABNORMAL HIGH (ref 4.0–10.5)
nRBC: 0 % (ref 0.0–0.2)

## 2018-12-26 LAB — COMPREHENSIVE METABOLIC PANEL
ALT: 71 U/L — ABNORMAL HIGH (ref 0–44)
AST: 32 U/L (ref 15–41)
Albumin: 3.8 g/dL (ref 3.5–5.0)
Alkaline Phosphatase: 74 U/L (ref 38–126)
Anion gap: 14 (ref 5–15)
BUN: 12 mg/dL (ref 6–20)
CO2: 22 mmol/L (ref 22–32)
Calcium: 9.3 mg/dL (ref 8.9–10.3)
Chloride: 101 mmol/L (ref 98–111)
Creatinine, Ser: 1.13 mg/dL (ref 0.61–1.24)
GFR calc Af Amer: >60 mL/min (ref >60)
GFR calc non Af Amer: >60 mL/min (ref >60)
Glucose, Bld: 236 mg/dL — ABNORMAL HIGH (ref 70–99)
Potassium: 3.8 mmol/L (ref 3.5–5.1)
Sodium: 137 mmol/L (ref 135–145)
Total Bilirubin: 0.9 mg/dL (ref 0.3–1.2)
Total Protein: 6.5 g/dL (ref 6.5–8.1)

## 2018-12-26 LAB — LACTIC ACID, PLASMA: Lactic Acid, Venous: 1.9 mmol/L (ref 0.5–1.9)

## 2018-12-26 LAB — LIPASE, BLOOD: Lipase: 26 U/L (ref 11–51)

## 2018-12-26 MED ORDER — SODIUM CHLORIDE 0.9% FLUSH
3.0000 mL | Freq: Once | INTRAVENOUS | Status: AC
Start: 2018-12-26 — End: 2018-12-26
  Administered 2018-12-26: 22:00:00 3 mL via INTRAVENOUS

## 2018-12-26 MED ORDER — HYDROMORPHONE HCL 1 MG/ML IJ SOLN
1.0000 mg | Freq: Once | INTRAMUSCULAR | Status: AC
Start: 2018-12-26 — End: 2018-12-26
  Administered 2018-12-26: 1 mg via INTRAVENOUS
  Filled 2018-12-26: qty 1

## 2018-12-26 MED ORDER — KETOROLAC TROMETHAMINE 15 MG/ML IJ SOLN
15.0000 mg | Freq: Once | INTRAMUSCULAR | Status: AC
  Administered 2018-12-26: 22:00:00 15 mg via INTRAVENOUS
  Filled 2018-12-26: qty 1

## 2018-12-26 MED ORDER — MORPHINE SULFATE (PF) 4 MG/ML IV SOLN
4.0000 mg | Freq: Once | INTRAVENOUS | Status: AC
  Administered 2018-12-26: 22:00:00 4 mg via INTRAVENOUS
  Filled 2018-12-26: qty 1

## 2018-12-26 MED ORDER — SODIUM CHLORIDE 0.9 % IV BOLUS (SEPSIS)
1000.0000 mL | Freq: Once | INTRAVENOUS | Status: AC
  Administered 2018-12-26: 22:00:00 1000 mL via INTRAVENOUS

## 2018-12-26 MED ORDER — OXYCODONE-ACETAMINOPHEN 5-325 MG PO TABS: 2.0000 | ORAL_TABLET | ORAL | 0 refills | Status: DC | PRN

## 2018-12-26 MED ORDER — LIDOCAINE 4 % EX PTCH: 1.0000 | MEDICATED_PATCH | CUTANEOUS | 0 refills | Status: DC | PRN

## 2018-12-26 NOTE — ED Provider Notes (Signed)
[place] EMERGENCY DEPARTMENT Provider Note   CSN: [id number] Arrival date & time: 12/26/18  1417     History   Chief Complaint Chief Complaint  Patient presents with   Herpes Zoster   Flank Pain    HPI Chad Hurley is a 43 y.o. male.     Patient is a 43 year old male with significant past medical history of ADHD, depression, kidney stones, shingles, asthma on immune modulating medication, presenting to the emergency department for right-sided flank pain.  Patient reports that last Thursday he noticed right-sided flank and abdominal pain which was similar to pain he has had in the past with kidney stones.  He then noticed a rash developing the same day.  Went to urgent care on Friday and was diagnosed with shingles and started on valacyclovir and has been taking this since then.  Reports that the pain is actually gotten worse and he is now having urinary urgency and burning with urination.  He reports thinking that the shingles is a spreading and that he is also having a kidney stone at the same time.  Reports recently completed a course of doxycycline for folliculitis.  He denies any fever, chills, nausea, vomiting.  He has been taking tramadol and cyclobenzaprine without relief.     Past Medical History:  Diagnosis Date   ADHD (attention deficit hyperactivity disorder)    Asthma    Chronic otitis externa    Depression    GERD (gastroesophageal reflux disease)    H/O ETOH abuse    06/27/09 sobriety date   Heat stroke    age 86   History of kidney stones    Pneumonia    "years ago"   PTSD (post-traumatic stress disorder)    Shingles    Sinusitis    Sleep apnea    occ cpap    Patient Active Problem List   Diagnosis Date Noted   Allergic rhinitis 04/10/2015   OSA on CPAP 05/28/2011   Severe persistent asthma 12/23/2009   NONSPECIFIC ABNORMAL TOXICOLOGICAL FINDINGS 12/05/2009   ALCOHOLISM 10/03/2009   PTSD 10/03/2009    INSOMNIA 05/02/2009   GERD 05/05/2007   DEPRESSION 05/03/2007   RHINITIS, CHRONIC 05/03/2007   ALLERGIC RHINITIS 05/03/2007    Past Surgical History:  Procedure Laterality Date   BAHA REVISION Right 11/08/2016   Procedure: BONE ANCHORED HEARING AID (BAHA) REVISION;  Surgeon: Melida Quitter, MD;  Location: [facility];  Service: ENT;  Laterality: Right;  REPLACEMENT RIGHT BAHA   bilateral tympanoplasty     and removal of cholestereatoma   BONE ANCHORED HEARING AID IMPLANT Right 06/09/2015   Procedure: RIGHT MASTOID DEBRIDEMENT; RIGHT BONE ANCHORED HEARING AID (BAHA) IMPLANT;  Surgeon: Melida Quitter, MD;  Location: [facility] OR;  Service: ENT;  Laterality: Right;   KNEE SURGERY Right    NASAL TURBINATE REDUCTION  2013   [facility]   SEPTOPLASTY  2008        Home Medications    Prior to Admission medications   Medication Sig Start Date End Date Taking? Authorizing Provider  beclomethasone (QVAR) 80 MCG/ACT inhaler Inhale 4 puffs into the lungs 4 (four) times daily as needed (shortness of breath).     [provider]  cyclobenzaprine (FLEXERIL) 10 MG tablet Take 10 mg by mouth 3 (three) times daily as needed for muscle spasms.    [provider]  dexamethasone (DECADRON) 4 MG tablet Take 4 mg by mouth daily as needed (asthma attacks).     [provider]  diphenhydrAMINE (BENADRYL) 25 MG tablet Take 50-100 mg by mouth at bedtime. 50-100 mg depending on insomnia    [provider]  fluticasone (FLONASE) 50 MCG/ACT nasal spray Place 2 sprays into both nostrils daily as needed for allergies or rhinitis.    [provider]  ibuprofen (ADVIL,MOTRIN) 200 MG tablet Take 800 mg by mouth daily as needed for headache.    [provider]  lansoprazole (PREVACID) 30 MG capsule Take 60 mg by mouth daily as needed (indigestion).     [provider]  Lidocaine (HM LIDOCAINE PATCH) 4 % PTCH Apply 1 patch topically as needed. 12/26/18 01/25/19  Arlyn Dunning, PA-C  Mepolizumab 100 MG SOLR Inject 100 mg into the skin every 30 (thirty) days. Given at [facility] in [place]  last dose 10-22-16    [provider]  oxyCODONE-acetaminophen (PERCOCET/ROXICET) 5-325 MG tablet Take 2 tablets by mouth every 4 (four) hours as needed for severe pain. 12/26/18   Arlyn Dunning, PA-C    Family History Family History  Problem Relation Age of Onset   Stomach cancer Other        grandfather   Allergic rhinitis Father    Sleep apnea Father     Social History Social History   Tobacco Use   Smoking status: Never Smoker   Smokeless tobacco: Never Used  Substance Use Topics   Alcohol use: No    Comment: no alcohol in 7 yrs.  etoh   Drug use: No    Types: Marijuana    Comment: stopped     Allergies   Bacillus, Benzodiazepines, Clindamycin/lincomycin, Other, Beta adrenergic blockers, and Morphine and related   Review of Systems Review of Systems  Constitutional: Positive for fever. Negative for appetite change and fatigue.  HENT: Negative for congestion.   Respiratory: Negative for cough and shortness of breath.   Gastrointestinal: Positive for abdominal pain. Negative for constipation, diarrhea, nausea and vomiting.  Endocrine: Negative for polyuria.  Genitourinary: Positive for dysuria, flank pain, frequency and urgency. Negative for decreased urine volume, difficulty urinating and testicular pain.  Musculoskeletal: Negative for arthralgias, back pain and myalgias.  Skin: Positive for rash. Negative for wound.  Allergic/Immunologic: Positive for immunocompromised state.     Physical Exam Updated Vital Signs BP (!) 146/115    Pulse (!) 118    Temp 98.6 F (37 C) (Oral)    Resp 18    Ht 5\' 8"  (1.727 m)    Wt 102.1 kg    SpO2 96%    BMI 34.21 kg/m   Physical Exam Vitals signs and nursing note reviewed.  Constitutional:      General: He is not in acute distress.    Appearance: Normal appearance. He is obese. He is not  ill-appearing, toxic-appearing or diaphoretic.  HENT:     Head: Normocephalic and atraumatic.     Mouth/Throat:     Mouth: Mucous membranes are moist.  Eyes:     Conjunctiva/sclera: Conjunctivae normal.  Cardiovascular:     Rate and Rhythm: Normal rate and regular rhythm.  Pulmonary:     Effort: Pulmonary effort is normal.  Abdominal:     General: Abdomen is flat. Bowel sounds are normal.     Tenderness: There is abdominal tenderness in the right upper quadrant and right lower quadrant. There is no right CVA tenderness, left CVA tenderness, guarding or rebound. Negative signs include Murphy's sign.  Skin:    General: Skin is warm and  dry.     Comments: There are 2 separate clusters of a vesicular rash with some that have crusted over and no surrounding erythema on the right anterior abdomen in a T11 and 12 distribution.  Neurological:     Mental Status: He is alert.  Psychiatric:        Mood and Affect: Mood normal.      ED Treatments / Results  Labs (all labs ordered are listed, but only abnormal results are displayed) Labs Reviewed  COMPREHENSIVE METABOLIC PANEL - Abnormal; Notable for the following components:      Result Value   Glucose, Bld 236 (*)    ALT 71 (*)    All other components within normal limits  CBC - Abnormal; Notable for the following components:   WBC 18.2 (*)    RBC 6.11 (*)    Hemoglobin 18.9 (*)    HCT 54.4 (*)    All other components within normal limits  LIPASE, BLOOD  LACTIC ACID, PLASMA  URINALYSIS, ROUTINE W REFLEX MICROSCOPIC    EKG None  Radiology Ct Renal Stone Study  Result Date: 12/26/2018 CLINICAL DATA:  Flank pain EXAM: CT ABDOMEN AND PELVIS WITHOUT CONTRAST TECHNIQUE: Multidetector CT imaging of the abdomen and pelvis was performed following the standard protocol without IV contrast. COMPARISON:  CT 07/09/2014 FINDINGS: Lower chest: Lung bases demonstrate no acute consolidation or effusion. Linear atelectasis or scar at the left  base. Normal heart size. Hepatobiliary: Hepatic steatosis with fat sparing near the gallbladder fossa. No calcified stone or biliary dilatation Pancreas: Unremarkable. No pancreatic ductal dilatation or surrounding inflammatory changes. Spleen: Normal in size without focal abnormality. Adrenals/Urinary Tract: Adrenal glands are normal. No hydronephrosis. Punctate intrarenal stones bilaterally. Urinary bladder is unremarkable Stomach/Bowel: Stomach is within normal limits. Appendix appears normal. No evidence of bowel wall thickening, distention, or inflammatory changes. Vascular/Lymphatic: Mild aortic atherosclerosis. No aneurysm. No significantly enlarged lymph nodes Reproductive: Prostate is unremarkable. Other: Negative for free air or free fluid. Fat in the umbilical region Musculoskeletal: No acute or significant osseous findings. IMPRESSION: 1. Small intrarenal stones bilaterally. Negative for hydronephrosis or ureteral stone 2. Hepatic steatosis with fat sparing near the gallbladder fossa Electronically Signed   By: Jasmine PangKim  Fujinaga M.D.   On: 12/26/2018 22:22    Procedures Procedures (including critical care time)  Medications Ordered in ED Medications  sodium chloride flush (NS) 0.9 % injection 3 mL (3 mLs Intravenous Given 12/26/18 2151)  sodium chloride 0.9 % bolus 1,000 mL (0 mLs Intravenous Stopped 12/26/18 2328)  ketorolac (TORADOL) 15 MG/ML injection 15 mg (15 mg Intravenous Given 12/26/18 2142)  morphine 4 MG/ML injection 4 mg (4 mg Intravenous Given 12/26/18 2142)  HYDROmorphone (DILAUDID) injection 1 mg (1 mg Intravenous Given 12/26/18 2333)     Initial Impression / Assessment and Plan / ED Course  I have reviewed the triage vital signs and the nursing notes.  Pertinent labs & imaging results that were available during my care of the patient were reviewed by me and considered in my medical decision making (see chart for details).  Clinical Course as of Dec 26 2342  Tue Dec 26, 2018  2128 Patient with history of asthma on immune suppressing medication presenting to the emergency department for shingles since last Thursday as well as dysuria and concern for recurrent kidney stone.  He has been taking valacyclovir since last Friday but reports the pain and the rash seems like it is getting worse.   [KM]  2238  Labs significant for leukocytosis to 18.2.  Patient has leukocytosis at baseline although it is mildly elevated from his baseline today.  He is afebrile and has a lactic acid of 1.9 with a metabolic panel significant for a glucose of 236 and ALT of 71 but otherwise unremarkable.  Patient was unable to urinate while here in the emergency department.  Last urinated 12 hours ago.  He had about 200 mL of urine in his bladder on bladder scan.  Plan to put Foley in the patient and have him follow-up with urology.  If patient has bacteria or signs of urinary tract infection in his urine we will start him on Keflex otherwise, sent home with oxycodone and lidocaine patches and follow-up with primary care and urology.  Currently pending urinalysis.  Case signed out to [place] [facility] due to change of shift.   [KM]    Clinical Course User Index [KM] Arlyn Dunning, PA-C        Final Clinical Impressions(s) / ED Diagnoses   Final diagnoses:  Herpes zoster without complication    ED Discharge Orders         Ordered    Lidocaine (HM LIDOCAINE PATCH) 4 % PTCH  As needed     12/26/18 2343    oxyCODONE-acetaminophen (PERCOCET/ROXICET) 5-325 MG tablet  Every 4 hours PRN     12/26/18 2343           Arlyn Dunning, PA-C 12/26/18 2344    Sabas Sous, MD 12/27/18 [phone]

## 2018-12-26 NOTE — ED Notes (Signed)
EDP at bedside  

## 2018-12-26 NOTE — ED Notes (Signed)
Bladder scan done.  Pt had a minimum of 200 cc of urine in bladder.  Provider wants foley placed for D/C

## 2018-12-26 NOTE — ED Triage Notes (Signed)
Pt reports shingles raash to right lower abdomen. Also reports he thinks he may be passing a kidney stone as well. Pt diaphoretic, tachycardiac in triage.

## 2018-12-26 NOTE — ED Provider Notes (Signed)
Patient seen by [facility] Pending UA after foley insertion for retention. Has shingles causing flank pain. Pain is difficult to control.  Pending UA to determine disposition.   UA negative for infection. Recheck finds the patient with persistent but improved pain. Ativan considered for pain relief of shingles but is listed as an allergy on patient record. 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   Ativan dose given in ED prior to discharge. The patient requests Flexeril for home for additional pain relief which is provided. He states he is ready for discharge home. Will follow up with urology for re-evaluation of the need for foley catheter.   Charlann Lange, PA-C 12/27/18 1478    Maudie Flakes, MD 12/27/18 [phone]

## 2018-12-26 NOTE — Discharge Instructions (Addendum)
Today you were diagnosed with shingles. Your CT scan showed no obstructing kidney stone or other abdominal issue that would cause the pain you are having. You also had lab work which did not indicate that you had sepsis or an infection that has spread to your blood. Shingles is very painful but you are taking the correct antiviral medication for this. The treatment is to continue the antiviral medication and to treat your pain.  He also had some urinary retention today.  This can be caused by many things and will need to follow-up with a urologist.  We have placed a Foley catheter which you should keep in place until you can see urology.  Call and make an appointment for sometime in the next couple of days.  We will send you a prescription for numbing patches and narcotic pain medication.  Narcotic pain medication can cause sleepiness, drowsiness and addiction even if taken as prescribed.  It is very important that you take your medication only as prescribed and not anymore.  In some cases, even when the shingles rash is gone you may still have pain. If this happens your primary care provider may change your medications or refer you to pain management to receive special injections to numb the nerve roots that are in pain. Please return to the ER if you develop a fever or any new or worsening symptoms. Thank you for allowing me to care for you today.

## 2018-12-27 DIAGNOSIS — B029 Zoster without complications: Secondary | ICD-10-CM | POA: Diagnosis not present

## 2018-12-27 LAB — URINALYSIS, ROUTINE W REFLEX MICROSCOPIC
Bilirubin Urine: NEGATIVE
Glucose, UA: 50 mg/dL — AB
Hgb urine dipstick: NEGATIVE
Ketones, ur: NEGATIVE mg/dL
Leukocytes,Ua: NEGATIVE
Nitrite: NEGATIVE
Protein, ur: NEGATIVE mg/dL
Specific Gravity, Urine: 1.024 (ref 1.005–1.030)
pH: 5 (ref 5.0–8.0)

## 2018-12-27 MED ORDER — CYCLOBENZAPRINE HCL 10 MG PO TABS: 10.0000 mg | ORAL_TABLET | Freq: Two times a day (BID) | ORAL | 0 refills | Status: AC | PRN

## 2018-12-27 MED ORDER — OXYCODONE-ACETAMINOPHEN 5-325 MG PO TABS: 2.0000 | ORAL_TABLET | ORAL | 0 refills | Status: DC | PRN

## 2018-12-27 MED ORDER — LIDOCAINE 4 % EX PTCH: 1.0000 | MEDICATED_PATCH | CUTANEOUS | 0 refills | Status: AC | PRN

## 2018-12-27 MED ORDER — HYDROMORPHONE HCL 1 MG/ML IJ SOLN
1.0000 mg | Freq: Once | INTRAMUSCULAR | Status: AC
  Administered 2018-12-27: 01:00:00 1 mg via INTRAVENOUS
  Filled 2018-12-27: qty 1

## 2018-12-27 MED ORDER — LORAZEPAM 2 MG/ML IJ SOLN
1.0000 mg | Freq: Once | INTRAMUSCULAR | Status: AC
  Administered 2018-12-27: 02:00:00 1 mg via INTRAVENOUS
  Filled 2018-12-27: qty 1

## 2018-12-27 NOTE — ED Notes (Signed)
Wasted 1mg  Ativan with Tray, RN. Patient left prior to wasting.

## 2018-12-27 NOTE — ED Notes (Signed)
Patient verbalizes understanding of discharge instructions. Opportunity for questioning and answers were provided. Armband removed by staff, pt discharged from ED. Pt. ambulatory and discharged home.  

## 2019-01-02 ENCOUNTER — Other Ambulatory Visit: Payer: Self-pay

## 2019-01-02 ENCOUNTER — Encounter (HOSPITAL_COMMUNITY): Payer: Self-pay | Admitting: Emergency Medicine

## 2019-01-02 ENCOUNTER — Emergency Department (HOSPITAL_COMMUNITY): Payer: No Typology Code available for payment source

## 2019-01-02 ENCOUNTER — Emergency Department (HOSPITAL_COMMUNITY)
Admission: EM | Admit: 2019-01-02 | Discharge: 2019-01-02 | Disposition: A | Discharge status: Home / Self Care | Payer: No Typology Code available for payment source | Attending: Emergency Medicine | Admitting: Emergency Medicine

## 2019-01-02 DIAGNOSIS — R300 Dysuria: Secondary | ICD-10-CM | POA: Diagnosis not present

## 2019-01-02 DIAGNOSIS — Z79899 Other long term (current) drug therapy: Secondary | ICD-10-CM | POA: Diagnosis not present

## 2019-01-02 DIAGNOSIS — Z885 Allergy status to narcotic agent status: Secondary | ICD-10-CM | POA: Insufficient documentation

## 2019-01-02 DIAGNOSIS — B0229 Other postherpetic nervous system involvement: Secondary | ICD-10-CM

## 2019-01-02 DIAGNOSIS — R1110 Vomiting, unspecified: Secondary | ICD-10-CM | POA: Insufficient documentation

## 2019-01-02 DIAGNOSIS — M545 Low back pain: Secondary | ICD-10-CM | POA: Diagnosis present

## 2019-01-02 DIAGNOSIS — B0223 Postherpetic polyneuropathy: Secondary | ICD-10-CM | POA: Diagnosis not present

## 2019-01-02 DIAGNOSIS — Z888 Allergy status to other drugs, medicaments and biological substances status: Secondary | ICD-10-CM | POA: Insufficient documentation

## 2019-01-02 DIAGNOSIS — Z881 Allergy status to other antibiotic agents status: Secondary | ICD-10-CM | POA: Insufficient documentation

## 2019-01-02 LAB — CBC WITH DIFFERENTIAL/PLATELET
Abs Immature Granulocytes: 0.12 10*3/uL — ABNORMAL HIGH (ref 0.00–0.07)
Basophils Absolute: 0.1 10*3/uL (ref 0.0–0.1)
Basophils Relative: 1 %
Eosinophils Absolute: 0.1 10*3/uL (ref 0.0–0.5)
Eosinophils Relative: 1 %
HCT: 44.9 % (ref 39.0–52.0)
Hemoglobin: 15.5 g/dL (ref 13.0–17.0)
Immature Granulocytes: 1 %
Lymphocytes Relative: 31 %
Lymphs Abs: 3 10*3/uL (ref 0.7–4.0)
MCH: 30.8 pg (ref 26.0–34.0)
MCHC: 34.5 g/dL (ref 30.0–36.0)
MCV: 89.1 fL (ref 80.0–100.0)
Monocytes Absolute: 0.7 10*3/uL (ref 0.1–1.0)
Monocytes Relative: 7 %
Neutro Abs: 5.7 10*3/uL (ref 1.7–7.7)
Neutrophils Relative %: 59 %
Platelets: 322 10*3/uL (ref 150–400)
RBC: 5.04 MIL/uL (ref 4.22–5.81)
RDW: 14.4 % (ref 11.5–15.5)
WBC: 9.6 10*3/uL (ref 4.0–10.5)
nRBC: 0 % (ref 0.0–0.2)

## 2019-01-02 LAB — URINALYSIS, ROUTINE W REFLEX MICROSCOPIC
Bilirubin Urine: NEGATIVE
Glucose, UA: 50 mg/dL — AB
Ketones, ur: 5 mg/dL — AB
Leukocytes,Ua: NEGATIVE
Nitrite: NEGATIVE
Protein, ur: 30 mg/dL — AB
Specific Gravity, Urine: 1.021 (ref 1.005–1.030)
pH: 6 (ref 5.0–8.0)

## 2019-01-02 LAB — COMPREHENSIVE METABOLIC PANEL
ALT: 47 U/L — ABNORMAL HIGH (ref 0–44)
AST: 55 U/L — ABNORMAL HIGH (ref 15–41)
Albumin: 3.3 g/dL — ABNORMAL LOW (ref 3.5–5.0)
Alkaline Phosphatase: 55 U/L (ref 38–126)
Anion gap: 9 (ref 5–15)
BUN: 8 mg/dL (ref 6–20)
CO2: 22 mmol/L (ref 22–32)
Calcium: 7.9 mg/dL — ABNORMAL LOW (ref 8.9–10.3)
Chloride: 109 mmol/L (ref 98–111)
Creatinine, Ser: 0.72 mg/dL (ref 0.61–1.24)
GFR calc Af Amer: >60 mL/min (ref >60)
GFR calc non Af Amer: >60 mL/min (ref >60)
Glucose, Bld: 125 mg/dL — ABNORMAL HIGH (ref 70–99)
Potassium: 3.9 mmol/L (ref 3.5–5.1)
Sodium: 140 mmol/L (ref 135–145)
Total Bilirubin: 1.4 mg/dL — ABNORMAL HIGH (ref 0.3–1.2)
Total Protein: 6 g/dL — ABNORMAL LOW (ref 6.5–8.1)

## 2019-01-02 LAB — LACTIC ACID, PLASMA: Lactic Acid, Venous: 1.5 mmol/L (ref 0.5–1.9)

## 2019-01-02 MED ORDER — KETOROLAC TROMETHAMINE 15 MG/ML IJ SOLN
15.0000 mg | Freq: Once | INTRAMUSCULAR | Status: AC
  Administered 2019-01-02: 16:00:00 15 mg via INTRAVENOUS
  Filled 2019-01-02: qty 1

## 2019-01-02 MED ORDER — HYDROMORPHONE HCL 1 MG/ML IJ SOLN
1.0000 mg | Freq: Once | INTRAMUSCULAR | Status: AC
  Administered 2019-01-02: 1 mg via INTRAVENOUS
  Filled 2019-01-02: qty 1

## 2019-01-02 MED ORDER — LACTATED RINGERS IV BOLUS
1000.0000 mL | Freq: Once | INTRAVENOUS | Status: AC
  Administered 2019-01-02: 15:00:00 1000 mL via INTRAVENOUS

## 2019-01-02 MED ORDER — HYDROMORPHONE HCL 1 MG/ML IJ SOLN
1.0000 mg | Freq: Once | INTRAMUSCULAR | Status: AC
  Administered 2019-01-02: 15:00:00 1 mg via INTRAVENOUS
  Filled 2019-01-02: qty 1

## 2019-01-02 MED ORDER — HYDROMORPHONE HCL 4 MG PO TABS: 4.0000 mg | ORAL_TABLET | ORAL | 0 refills | Status: AC | PRN

## 2019-01-02 MED ORDER — HYDROMORPHONE HCL 2 MG PO TABS
4.0000 mg | ORAL_TABLET | Freq: Once | ORAL | Status: AC
  Administered 2019-01-02: 17:00:00 4 mg via ORAL
  Filled 2019-01-02: qty 2

## 2019-01-02 NOTE — ED Notes (Signed)
PT request pain med before rectal temp

## 2019-01-02 NOTE — ED Notes (Signed)
X-ray at bedside

## 2019-01-02 NOTE — ED Triage Notes (Addendum)
Patient arrived by EMS from home. Pt c/o Rt sided back pain.   EMS gave 100 mcg of Fentanyl from EMS.  Patient had had shingles on back x 3 weeks.

## 2019-01-02 NOTE — Discharge Instructions (Signed)
If you develop worsening, recurrent, or continued back pain, numbness or weakness in the legs, incontinence of your bowels or bladders, numbness of your buttocks, fever, abdominal pain, or any other new/concerning symptoms then return to the ER for evaluation.  
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
## 2019-01-03 LAB — URINE CULTURE: Culture: <10000 — AB

## 2020-10-15 ENCOUNTER — Institutional Professional Consult (permissible substitution): Payer: No Typology Code available for payment source | Admitting: Internal Medicine

## 2020-10-30 ENCOUNTER — Other Ambulatory Visit: Payer: Self-pay

## 2020-10-30 ENCOUNTER — Encounter: Payer: Self-pay | Admitting: Pulmonary Disease

## 2020-10-30 ENCOUNTER — Ambulatory Visit (INDEPENDENT_AMBULATORY_CARE_PROVIDER_SITE_OTHER): Payer: No Typology Code available for payment source | Admitting: Pulmonary Disease

## 2020-10-30 VITALS — BP 138/76 | HR 116 | Temp 98.1°F | Ht 68.0 in | Wt 228.4 lb

## 2020-10-30 DIAGNOSIS — J45909 Unspecified asthma, uncomplicated: Secondary | ICD-10-CM | POA: Diagnosis not present

## 2020-10-30 NOTE — Patient Instructions (Signed)
We will try to get more records from the [place] Please see if you can get the PFTs and biopsies were done over the years  We will get CBC with differential, IgE and chest x-ray today Follow-up in 3 months
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
## 2020-10-31 LAB — CBC WITH DIFFERENTIAL/PLATELET
Basophils Absolute: 0.1 10*3/uL (ref 0.0–0.1)
Basophils Relative: 1.3 % (ref 0.0–3.0)
Eosinophils Absolute: 0.2 10*3/uL (ref 0.0–0.7)
Eosinophils Relative: 1.5 % (ref 0.0–5.0)
HCT: 52.4 % — ABNORMAL HIGH (ref 39.0–52.0)
Hemoglobin: 18 g/dL (ref 13.0–17.0)
Lymphocytes Relative: 39.4 % (ref 12.0–46.0)
Lymphs Abs: 4.4 10*3/uL — ABNORMAL HIGH (ref 0.7–4.0)
MCHC: 34.3 g/dL (ref 30.0–36.0)
MCV: 84.9 fl (ref 78.0–100.0)
Monocytes Absolute: 0.9 10*3/uL (ref 0.1–1.0)
Monocytes Relative: 8.2 % (ref 3.0–12.0)
Neutro Abs: 5.5 10*3/uL (ref 1.4–7.7)
Neutrophils Relative %: 49.6 % (ref 43.0–77.0)
Platelets: 314 10*3/uL (ref 150.0–400.0)
RBC: 6.18 Mil/uL — ABNORMAL HIGH (ref 4.22–5.81)
RDW: 14.7 % (ref 11.5–15.5)
WBC: 11.1 10*3/uL — ABNORMAL HIGH (ref 4.0–10.5)

## 2020-10-31 LAB — IGE: IgE (Immunoglobulin E), Serum: 43 kU/L (ref <=114)

## 2020-11-04 ENCOUNTER — Ambulatory Visit (INDEPENDENT_AMBULATORY_CARE_PROVIDER_SITE_OTHER): Payer: No Typology Code available for payment source

## 2020-11-04 DIAGNOSIS — J45909 Unspecified asthma, uncomplicated: Secondary | ICD-10-CM | POA: Diagnosis not present

## 2020-11-08 MED ORDER — ALBUTEROL SULFATE HFA 108 (90 BASE) MCG/ACT IN AERS: INHALATION_SPRAY | RESPIRATORY_TRACT | 6 refills | Status: AC

## 2020-11-08 NOTE — Telephone Encounter (Signed)
FYI for PM  Hello Dr. I have linked the account so you can see my lab results in [facility] My[facility]  for my biopsies in November. In my [place] records, you can find the results from my 2nd Biopsy.  If you have any questions, i will do my best!! Chad Hurley

## 2022-02-25 ENCOUNTER — Encounter (HOSPITAL_BASED_OUTPATIENT_CLINIC_OR_DEPARTMENT_OTHER): Payer: Self-pay

## 2022-02-25 ENCOUNTER — Emergency Department (HOSPITAL_BASED_OUTPATIENT_CLINIC_OR_DEPARTMENT_OTHER): Payer: No Typology Code available for payment source

## 2022-02-25 ENCOUNTER — Other Ambulatory Visit: Payer: Self-pay

## 2022-02-25 ENCOUNTER — Emergency Department (HOSPITAL_BASED_OUTPATIENT_CLINIC_OR_DEPARTMENT_OTHER)
Admission: EM | Admit: 2022-02-25 | Discharge: 2022-02-25 | Disposition: A | Discharge status: Home / Self Care | Payer: No Typology Code available for payment source | Attending: Emergency Medicine | Admitting: Emergency Medicine

## 2022-02-25 DIAGNOSIS — X158XXA Contact with other hot household appliances, initial encounter: Secondary | ICD-10-CM | POA: Insufficient documentation

## 2022-02-25 DIAGNOSIS — T22131A Burn of first degree of right upper arm, initial encounter: Secondary | ICD-10-CM | POA: Diagnosis not present

## 2022-02-25 DIAGNOSIS — T310 Burns involving less than 10% of body surface: Secondary | ICD-10-CM | POA: Diagnosis not present

## 2022-02-25 DIAGNOSIS — T22132A Burn of first degree of left upper arm, initial encounter: Secondary | ICD-10-CM | POA: Diagnosis not present

## 2022-02-25 DIAGNOSIS — T300 Burn of unspecified body region, unspecified degree: Secondary | ICD-10-CM

## 2022-02-25 DIAGNOSIS — J45909 Unspecified asthma, uncomplicated: Secondary | ICD-10-CM | POA: Diagnosis not present

## 2022-02-25 DIAGNOSIS — T2112XA Burn of first degree of abdominal wall, initial encounter: Secondary | ICD-10-CM | POA: Insufficient documentation

## 2022-02-25 DIAGNOSIS — Z7951 Long term (current) use of inhaled steroids: Secondary | ICD-10-CM | POA: Diagnosis not present

## 2022-02-25 LAB — COMPREHENSIVE METABOLIC PANEL
ALT: 41 U/L (ref 0–44)
AST: 20 U/L (ref 15–41)
Albumin: 5.1 g/dL — ABNORMAL HIGH (ref 3.5–5.0)
Alkaline Phosphatase: 87 U/L (ref 38–126)
Anion gap: 16 — ABNORMAL HIGH (ref 5–15)
BUN: 8 mg/dL (ref 6–20)
CO2: 23 mmol/L (ref 22–32)
Calcium: 9.5 mg/dL (ref 8.9–10.3)
Chloride: 98 mmol/L (ref 98–111)
Creatinine, Ser: 0.86 mg/dL (ref 0.61–1.24)
GFR, Estimated: >60 mL/min (ref >60)
Glucose, Bld: 284 mg/dL — ABNORMAL HIGH (ref 70–99)
Potassium: 3.2 mmol/L — ABNORMAL LOW (ref 3.5–5.1)
Sodium: 137 mmol/L (ref 135–145)
Total Bilirubin: 0.8 mg/dL (ref 0.3–1.2)
Total Protein: 7.3 g/dL (ref 6.5–8.1)

## 2022-02-25 LAB — CBC WITH DIFFERENTIAL/PLATELET
Abs Immature Granulocytes: 0.11 10*3/uL — ABNORMAL HIGH (ref 0.00–0.07)
Basophils Absolute: 0.1 10*3/uL (ref 0.0–0.1)
Basophils Relative: 0 %
Eosinophils Absolute: 0.1 10*3/uL (ref 0.0–0.5)
Eosinophils Relative: 1 %
HCT: 40.5 % (ref 39.0–52.0)
Hemoglobin: 14.2 g/dL (ref 13.0–17.0)
Immature Granulocytes: 1 %
Lymphocytes Relative: 30 %
Lymphs Abs: 4.1 10*3/uL — ABNORMAL HIGH (ref 0.7–4.0)
MCH: 29.9 pg (ref 26.0–34.0)
MCHC: 35.1 g/dL (ref 30.0–36.0)
MCV: 85.3 fL (ref 80.0–100.0)
Monocytes Absolute: 0.9 10*3/uL (ref 0.1–1.0)
Monocytes Relative: 7 %
Neutro Abs: 8.6 10*3/uL — ABNORMAL HIGH (ref 1.7–7.7)
Neutrophils Relative %: 61 %
Platelets: 246 10*3/uL (ref 150–400)
RBC: 4.75 MIL/uL (ref 4.22–5.81)
RDW: 13.4 % (ref 11.5–15.5)
WBC: 13.8 10*3/uL — ABNORMAL HIGH (ref 4.0–10.5)
nRBC: 0 % (ref 0.0–0.2)

## 2022-02-25 LAB — PROTIME-INR
INR: 0.9 (ref 0.8–1.2)
Prothrombin Time: 12.5 seconds (ref 11.4–15.2)

## 2022-02-25 MED ORDER — SODIUM CHLORIDE 0.9 % IV BOLUS
1000.0000 mL | Freq: Once | INTRAVENOUS | Status: AC
  Administered 2022-02-25: 1000 mL via INTRAVENOUS

## 2022-02-25 MED ORDER — FENTANYL CITRATE PF 50 MCG/ML IJ SOSY: 50.0000 ug | PREFILLED_SYRINGE | Freq: Once | INTRAMUSCULAR | Status: DC

## 2022-02-25 MED ORDER — CEPHALEXIN 500 MG PO CAPS: 500.0000 mg | ORAL_CAPSULE | Freq: Three times a day (TID) | ORAL | 0 refills | Status: AC

## 2022-02-25 MED ORDER — BACITRACIN ZINC 500 UNIT/GM EX OINT: 1.0000 | TOPICAL_OINTMENT | Freq: Two times a day (BID) | CUTANEOUS | 0 refills | Status: AC

## 2022-02-25 MED ORDER — BACITRACIN ZINC 500 UNIT/GM EX OINT
TOPICAL_OINTMENT | Freq: Two times a day (BID) | CUTANEOUS | Status: DC
  Filled 2022-02-25: qty 28.35

## 2022-02-25 MED ORDER — HYDROCODONE-ACETAMINOPHEN 5-325 MG PO TABS
2.0000 | ORAL_TABLET | Freq: Once | ORAL | Status: AC
  Administered 2022-02-25: 2 via ORAL
  Filled 2022-02-25: qty 2
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
## 2022-02-25 NOTE — ED Notes (Signed)
Pt verbalized understanding of d/c instructions, meds, and followup care. Denies questions. VSS, no distress noted. Steady gait to exit with all belongings. Family to transport home.
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
## 2022-02-25 NOTE — Discharge Instructions (Signed)
Keep burn clean and dry.  Use bacitracin topical dressing.  Take your pain medication as prescribed.  Return to the ED with new or worsening symptoms.

## 2022-02-28 ENCOUNTER — Emergency Department (HOSPITAL_COMMUNITY)
Admission: EM | Admit: 2022-02-28 | Discharge: 2022-02-28 | Disposition: A | Discharge status: Home / Self Care | Payer: No Typology Code available for payment source | Attending: Emergency Medicine | Admitting: Emergency Medicine

## 2022-02-28 DIAGNOSIS — T310 Burns involving less than 10% of body surface: Secondary | ICD-10-CM | POA: Diagnosis not present

## 2022-02-28 DIAGNOSIS — X011XXA Exposure to smoke in uncontrolled fire, not in building or structure, initial encounter: Secondary | ICD-10-CM | POA: Insufficient documentation

## 2022-02-28 DIAGNOSIS — T22132A Burn of first degree of left upper arm, initial encounter: Secondary | ICD-10-CM | POA: Diagnosis present

## 2022-02-28 MED ORDER — HYDROMORPHONE HCL 1 MG/ML IJ SOLN
2.0000 mg | Freq: Once | INTRAMUSCULAR | Status: AC
  Administered 2022-02-28: 2 mg via INTRAMUSCULAR
  Filled 2022-02-28: qty 2
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
## 2022-03-04 ENCOUNTER — Encounter (HOSPITAL_COMMUNITY): Payer: Self-pay

## 2022-11-23 IMAGING — DX DG CHEST 2V
2 series · 2 of 2 positions shown · non-contrast
Comparison: 01/02/2019

CLINICAL DATA: 45-year-old male with asthma

EXAM:
CHEST - 2 VIEW

[chest pa]
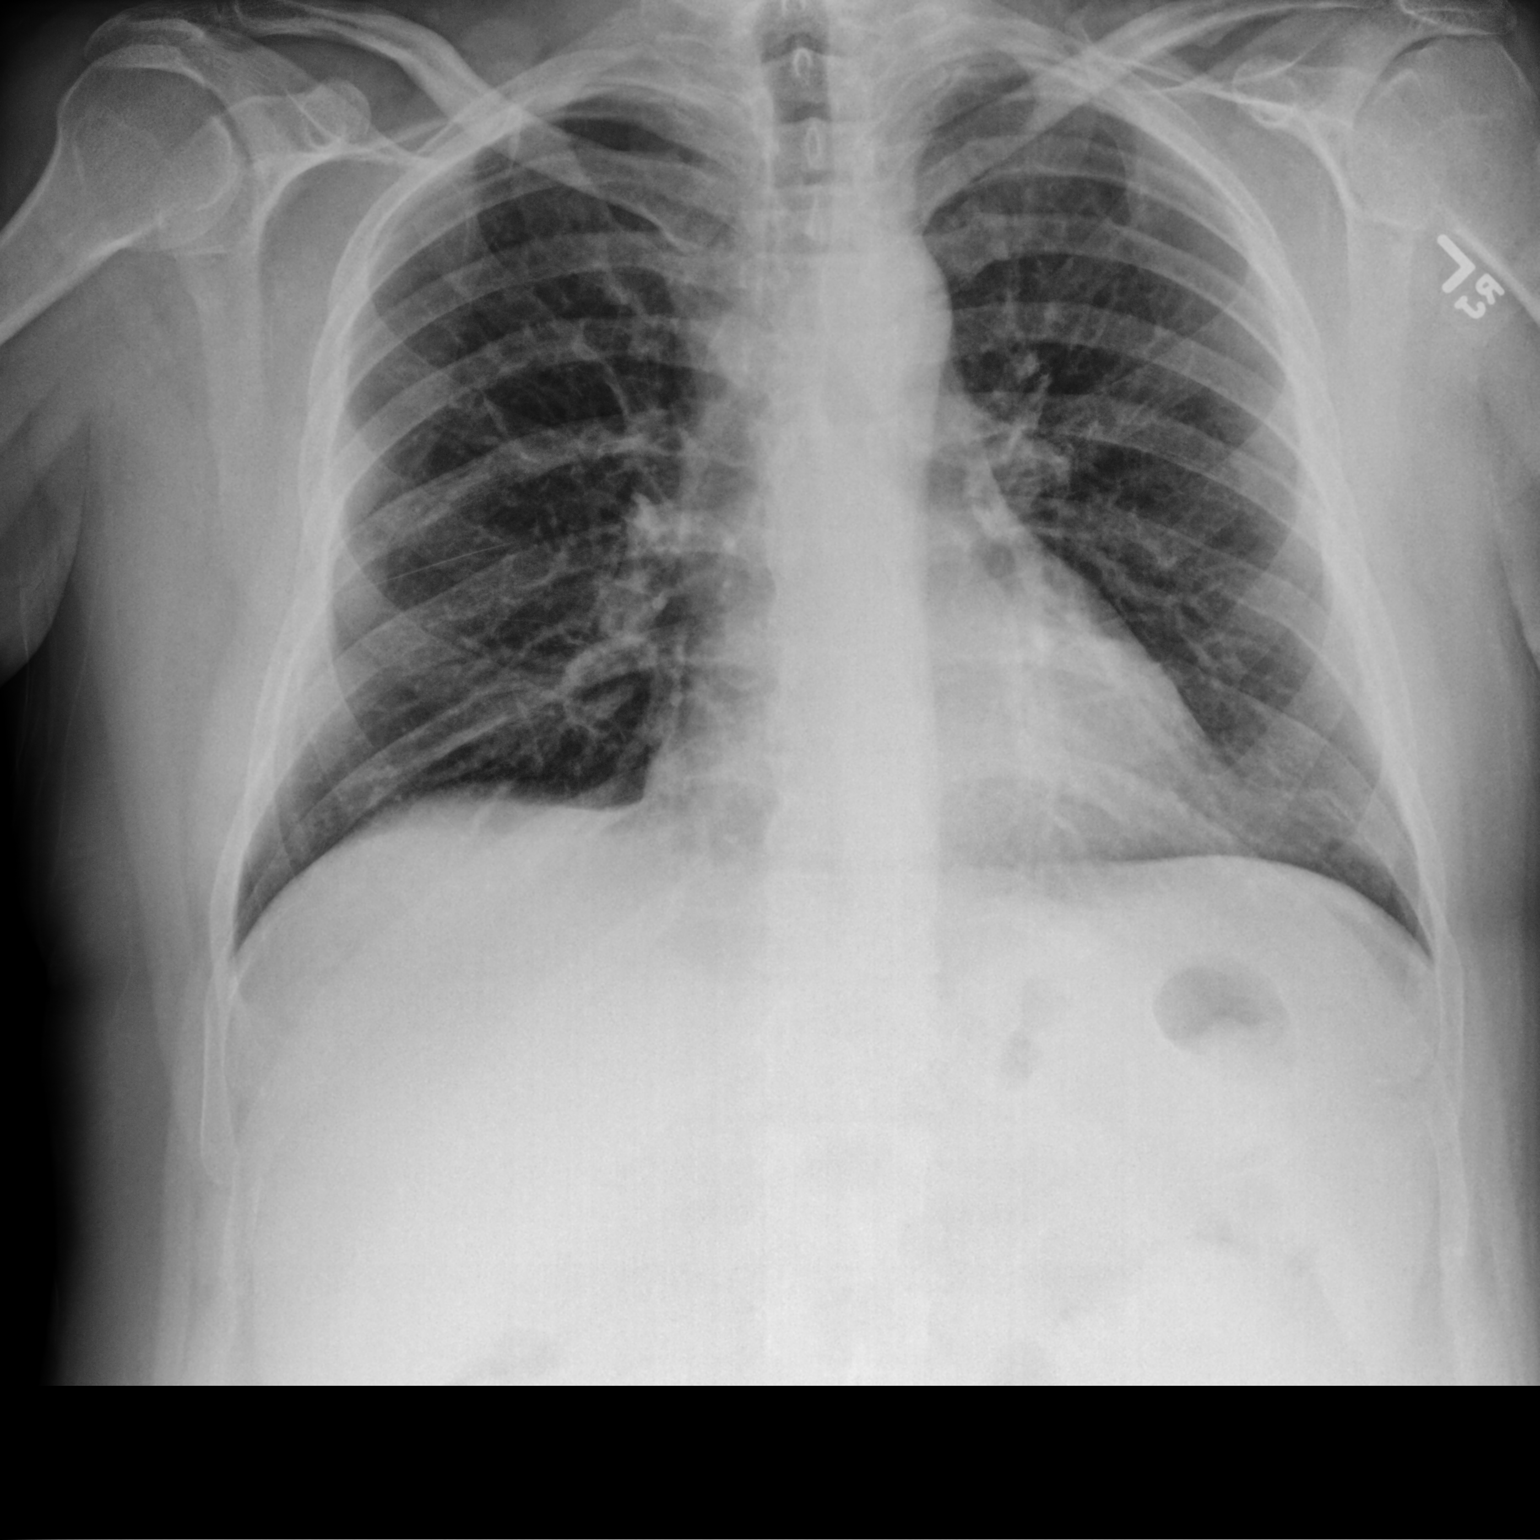

[chest lat]
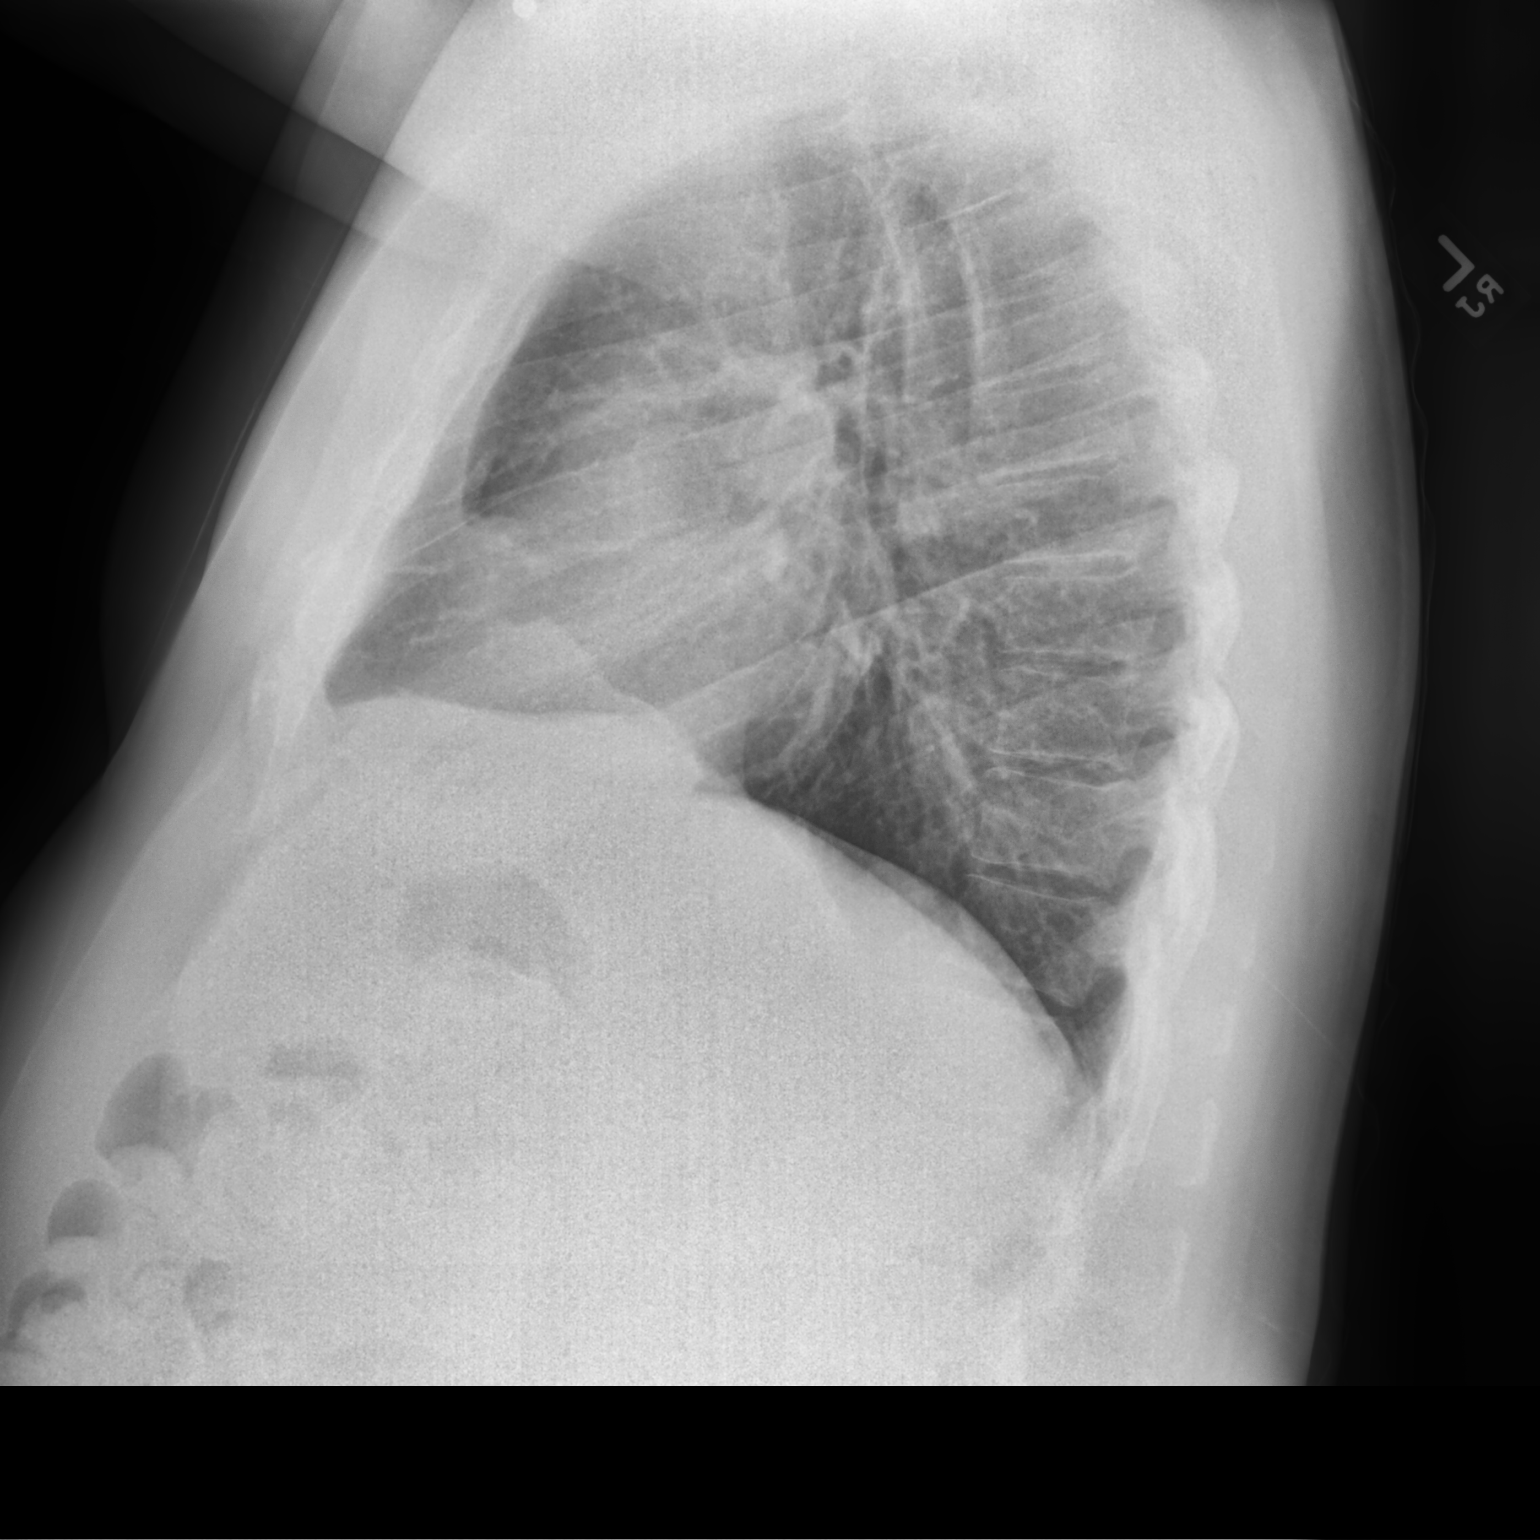

[2 of 2 positions shown; findings below may reference images not displayed]

FINDINGS: Cardiomediastinal silhouette unchanged in size and contour. No
evidence of central vascular congestion. No interlobular septal
thickening.

Low lung volumes persist.

No pneumothorax or pleural effusion. Coarsened interstitial
markings, with no confluent airspace disease.

No acute displaced fracture. Degenerative changes of the spine.
IMPRESSION: Low lung volumes without evidence of acute cardiopulmonary disease

## 2023-07-14 ENCOUNTER — Other Ambulatory Visit: Payer: Self-pay | Admitting: Physical Medicine & Rehabilitation

## 2023-07-14 DIAGNOSIS — G8929 Other chronic pain: Secondary | ICD-10-CM

## 2023-08-03 ENCOUNTER — Ambulatory Visit
Admission: RE | Admit: 2023-08-03 | Discharge: 2023-08-03 | Disposition: A | Discharge status: Home / Self Care | Source: Ambulatory Visit | Attending: Physical Medicine & Rehabilitation | Admitting: Physical Medicine & Rehabilitation

## 2023-08-03 DIAGNOSIS — G8929 Other chronic pain: Secondary | ICD-10-CM

## 2023-09-27 ENCOUNTER — Ambulatory Visit: Admitting: Neurosurgery
